# Patient Record
Sex: Female | Born: 1950
Health system: Southern US, Community
[De-identification: ages and names within clinical notes are randomized; demographics above are authoritative.]

## PROBLEM LIST (undated history)

## (undated) DIAGNOSIS — R718 Other abnormality of red blood cells: Secondary | ICD-10-CM

## (undated) DIAGNOSIS — E7800 Pure hypercholesterolemia, unspecified: Secondary | ICD-10-CM

## (undated) DIAGNOSIS — R112 Nausea with vomiting, unspecified: Secondary | ICD-10-CM

## (undated) DIAGNOSIS — F419 Anxiety disorder, unspecified: Secondary | ICD-10-CM

## (undated) DIAGNOSIS — Z9889 Other specified postprocedural states: Secondary | ICD-10-CM

## (undated) HISTORY — PX: ABDOMINAL HYSTERECTOMY: SHX81

## (undated) HISTORY — PX: TUBAL LIGATION: SHX77

## (undated) HISTORY — DX: Pure hypercholesterolemia, unspecified: E78.00

## (undated) HISTORY — PX: TONSILLECTOMY: SUR1361

---

## 2000-11-22 ENCOUNTER — Other Ambulatory Visit: Admission: RE | Admit: 2000-11-22 | Discharge: 2000-11-22 | Payer: Self-pay | Admitting: Obstetrics and Gynecology

## 2002-03-22 ENCOUNTER — Encounter: Admission: RE | Admit: 2002-03-22 | Discharge: 2002-03-22 | Payer: Self-pay | Admitting: Obstetrics and Gynecology

## 2002-03-22 ENCOUNTER — Encounter: Payer: Self-pay | Admitting: Obstetrics and Gynecology

## 2002-05-01 ENCOUNTER — Other Ambulatory Visit: Admission: RE | Admit: 2002-05-01 | Discharge: 2002-05-01 | Payer: Self-pay | Admitting: Obstetrics and Gynecology

## 2003-06-20 ENCOUNTER — Other Ambulatory Visit: Admission: RE | Admit: 2003-06-20 | Discharge: 2003-06-20 | Payer: Self-pay | Admitting: Obstetrics and Gynecology

## 2014-12-05 DIAGNOSIS — R109 Unspecified abdominal pain: Secondary | ICD-10-CM | POA: Diagnosis not present

## 2014-12-05 DIAGNOSIS — N949 Unspecified condition associated with female genital organs and menstrual cycle: Secondary | ICD-10-CM | POA: Diagnosis not present

## 2014-12-05 DIAGNOSIS — Z789 Other specified health status: Secondary | ICD-10-CM | POA: Diagnosis not present

## 2014-12-05 DIAGNOSIS — Z5329 Procedure and treatment not carried out because of patient's decision for other reasons: Secondary | ICD-10-CM | POA: Diagnosis not present

## 2014-12-05 DIAGNOSIS — Z713 Dietary counseling and surveillance: Secondary | ICD-10-CM | POA: Diagnosis not present

## 2014-12-05 DIAGNOSIS — Z6826 Body mass index (BMI) 26.0-26.9, adult: Secondary | ICD-10-CM | POA: Diagnosis not present

## 2014-12-05 DIAGNOSIS — Z418 Encounter for other procedures for purposes other than remedying health state: Secondary | ICD-10-CM | POA: Diagnosis not present

## 2014-12-09 ENCOUNTER — Other Ambulatory Visit (HOSPITAL_COMMUNITY): Payer: Self-pay | Admitting: Nurse Practitioner

## 2014-12-09 DIAGNOSIS — R1084 Generalized abdominal pain: Secondary | ICD-10-CM

## 2014-12-09 DIAGNOSIS — R52 Pain, unspecified: Secondary | ICD-10-CM

## 2014-12-10 ENCOUNTER — Ambulatory Visit (HOSPITAL_COMMUNITY)
Admission: RE | Admit: 2014-12-10 | Discharge: 2014-12-10 | Disposition: A | Discharge status: Home / Self Care | Payer: Medicare Other | Source: Ambulatory Visit | Attending: Nurse Practitioner | Admitting: Nurse Practitioner

## 2014-12-10 DIAGNOSIS — R102 Pelvic and perineal pain: Secondary | ICD-10-CM | POA: Diagnosis not present

## 2014-12-10 DIAGNOSIS — R52 Pain, unspecified: Secondary | ICD-10-CM

## 2014-12-10 DIAGNOSIS — R1084 Generalized abdominal pain: Secondary | ICD-10-CM

## 2014-12-10 DIAGNOSIS — Z90710 Acquired absence of both cervix and uterus: Secondary | ICD-10-CM | POA: Diagnosis not present

## 2014-12-10 DIAGNOSIS — K8020 Calculus of gallbladder without cholecystitis without obstruction: Secondary | ICD-10-CM | POA: Diagnosis not present

## 2014-12-10 DIAGNOSIS — R109 Unspecified abdominal pain: Secondary | ICD-10-CM | POA: Insufficient documentation

## 2014-12-12 DIAGNOSIS — K8020 Calculus of gallbladder without cholecystitis without obstruction: Secondary | ICD-10-CM | POA: Diagnosis not present

## 2014-12-19 DIAGNOSIS — K8020 Calculus of gallbladder without cholecystitis without obstruction: Secondary | ICD-10-CM | POA: Diagnosis not present

## 2014-12-20 NOTE — H&P (Signed)
  NTS SOAP Note  Vital Signs:  Vitals as of: 8/54/6270: Systolic 350: Diastolic 89: Heart Rate 71: Temp 98.96F: Height 84ft 2in: Weight 143Lbs 0 Ounces: Pain Level 1: BMI 26.15  BMI : 26.15 kg/m2  Subjective: This 64 year old female presents for of gallstones.  Has had intermittent episodes of right upper quadrant abdominal pain,  nausea,  and bloating.  Made worse with fatty food.  No fever,  chills,  jaundice.  U/S of gallbladder shows cholelithiasis,  normal common bile duct.  Review of Symptoms:  Constitutional:unremarkable   Head:unremarkable Eyes:unremarkable   Nose/Mouth/Throat:unremarkable Cardiovascular:  unremarkable Respiratory:unremarkable Gastrointestinabdominal pain, nausea, heartburn, dyspepsia Genitourinary:unremarkable   back pain Skin:unremarkable Hematolgic/Lymphatic:unremarkable   Allergic/Immunologic:unremarkable   Past Medical History:  Reviewed  Past Medical History  Surgical History: TAH,  d and c,  tonsillectomy Medical Problems: none Allergies: nkda Medications: none   Social History:Reviewed  Social History  Preferred Language: English Race:  White Ethnicity: Not Hispanic / Latino Age: 86 year Marital Status:  W Alcohol: no   Smoking Status: Never smoker reviewed on 12/19/2014 Functional Status reviewed on 12/19/2014 ------------------------------------------------ Bathing: Normal Cooking: Normal Dressing: Normal Driving: Normal Eating: Normal Managing Meds: Normal Oral Care: Normal Shopping: Normal Toileting: Normal Transferring: Normal Walking: Normal Cognitive Status reviewed on 12/19/2014 ------------------------------------------------ Attention: Normal Decision Making: Normal Language: Normal Memory: Normal Motor: Normal Perception: Normal Problem Solving: Normal Visual and Spatial: Normal   Family History:Reviewed  Family Health History Mother, Living; Disorder of thyroid gland;  Father,  Living; Malignant melanoma;     Objective Information: General:Well appearing, well nourished in no distress. Heart:RRR, no murmur or gallop.  Normal S1, S2.  No S3, S4.  Lungs:  CTA bilaterally, no wheezes, rhonchi, rales.  Breathing unlabored. Abdomen:Soft, NT/ND, no HSM, no masses.  Mild discomfort in right upper quadrant to deep palpation.  Assessment:Cholelithiasis  Diagnoses: 574.20  K80.20 Gallstone (Calculus of gallbladder without cholecystitis without obstruction)  Procedures: 09381 - OFFICE OUTPATIENT NEW 30 MINUTES    Plan:  Scheduled for laparoscopic cholecystectomy on 01/17/15.   Patient Education:Alternative treatments to surgery were discussed with patient (and family).  Risks and benefits  of procedure including bleeding,  infection,  hepatobiliary injury,  and the possibility of an open procedure were fully explained to the patient (and family) who gave informed consent. Patient/family questions were addressed.  Follow-up:Pending Surgery

## 2014-12-23 NOTE — H&P (Deleted)
  NTS SOAP Note  Vital Signs:  Vitals as of: 5/85/2778: Systolic 242: Diastolic 89: Heart Rate 71: Temp 98.42F: Height 64ft 2in: Weight 143Lbs 0 Ounces: Pain Level 1: BMI 26.15  BMI : 26.15 kg/m2  Subjective: This 64 year old female presents for of gallstones.  Has had intermittent episodes of right upper quadrant abdominal pain,  nausea,  and bloating.  Made worse with fatty food.  No fever,  chills,  jaundice.  U/S of gallbladder shows cholelithiasis,  normal common bile duct.  Review of Symptoms:  Constitutional:unremarkable   Head:unremarkable Eyes:unremarkable   Nose/Mouth/Throat:unremarkable Cardiovascular:  unremarkable Respiratory:unremarkable Gastrointestinabdominal pain, nausea, heartburn, dyspepsia Genitourinary:unremarkable   back pain Skin:unremarkable Hematolgic/Lymphatic:unremarkable   Allergic/Immunologic:unremarkable   Past Medical History:  Reviewed  Past Medical History  Surgical History: TAH,  d and c,  tonsillectomy Medical Problems: none Allergies: nkda Medications: none   Social History:Reviewed  Social History  Preferred Language: English Race:  White Ethnicity: Not Hispanic / Latino Age: 64 year Marital Status:  W Alcohol: no   Smoking Status: Never smoker reviewed on 12/19/2014 Functional Status reviewed on 12/19/2014 ------------------------------------------------ Bathing: Normal Cooking: Normal Dressing: Normal Driving: Normal Eating: Normal Managing Meds: Normal Oral Care: Normal Shopping: Normal Toileting: Normal Transferring: Normal Walking: Normal Cognitive Status reviewed on 12/19/2014 ------------------------------------------------ Attention: Normal Decision Making: Normal Language: Normal Memory: Normal Motor: Normal Perception: Normal Problem Solving: Normal Visual and Spatial: Normal   Family History:Reviewed  Family Health History Mother, Living; Disorder of thyroid gland;  Father,  Living; Malignant melanoma;     Objective Information: General:Well appearing, well nourished in no distress. Heart:RRR, no murmur or gallop.  Normal S1, S2.  No S3, S4.  Lungs:  CTA bilaterally, no wheezes, rhonchi, rales.  Breathing unlabored. Abdomen:Soft, NT/ND, no HSM, no masses.  Mild discomfort in right upper quadrant to deep palpation.  Assessment:Cholelithiasis  Diagnoses: 574.20  K80.20 Gallstone (Calculus of gallbladder without cholecystitis without obstruction)  Procedures: 35361 - OFFICE OUTPATIENT NEW 30 MINUTES    Plan:  Scheduled for laparoscopic cholecystectomy on 01/17/15.   Patient Education:Alternative treatments to surgery were discussed with patient (and family).  Risks and benefits  of procedure including bleeding,  infection,  hepatobiliary injury,  and the possibility of an open procedure were fully explained to the patient (and family) who gave informed consent. Patient/family questions were addressed.  Follow-up:Pending Surgery

## 2015-01-10 NOTE — Patient Instructions (Signed)
Rachel [name]  01/10/2015   Your procedure is scheduled on:  01/17/2015  Report to [facility] at  6  AM.  Call this number if you have problems the morning of surgery: [phone]   Remember:   Do not eat food or drink liquids after midnight.   Take these medicines the morning of surgery with A SIP OF WATER: none   Do not wear jewelry, make-up or nail polish.  Do not wear lotions, powders, or perfumes.   Do not shave 48 hours prior to surgery. Men may shave face and neck.  Do not bring valuables to the hospital.  [facility] is not responsible for any belongings or valuables.               Contacts, dentures or bridgework may not be worn into surgery.  Leave suitcase in the car. After surgery it may be brought to your room.  For patients admitted to the hospital, discharge time is determined by your treatment team.               Patients discharged the day of surgery will not be allowed to drive home.  Name and phone number of your driver: family  Special Instructions: Shower using CHG 2 nights before surgery and the night before surgery.  If you shower the day of surgery use CHG.  Use special wash - you have one bottle of CHG for all showers.  You should use approximately 1/3 of the bottle for each shower.   Please read over the following fact sheets that you were given: Pain Booklet, Coughing and Deep Breathing, Surgical Site Infection Prevention, Anesthesia Post-op Instructions and Care and Recovery After Surgery Laparoscopic Cholecystectomy Laparoscopic cholecystectomy is surgery to remove the gallbladder. The gallbladder is located in the upper right part of the abdomen, behind the liver. It is a storage sac for bile produced in the liver. Bile aids in the digestion and absorption of fats. Cholecystectomy is often done for inflammation of the gallbladder (cholecystitis). This condition is usually caused by a buildup of gallstones (cholelithiasis) in your gallbladder.  Gallstones can block the flow of bile, resulting in inflammation and pain. In severe cases, emergency surgery may be required. When emergency surgery is not required, you will have time to prepare for the procedure. Laparoscopic surgery is an alternative to open surgery. Laparoscopic surgery has a shorter recovery time. Your common bile duct may also need to be examined during the procedure. If stones are found in the common bile duct, they may be removed. LET [facility] CARE PROVIDER KNOW ABOUT:  Any allergies you have.  All medicines you are taking, including vitamins, herbs, eye drops, creams, and over-the-counter medicines.  Previous problems you or members of your family have had with the use of anesthetics.  Any blood disorders you have.  Previous surgeries you have had.  Medical conditions you have. RISKS AND COMPLICATIONS Generally, this is a safe procedure. However, as with any procedure, complications can occur. Possible complications include:  Infection.  Damage to the common bile duct, nerves, arteries, veins, or other internal organs such as the stomach, liver, or intestines.  Bleeding.  A stone may remain in the common bile duct.  A bile leak from the cyst duct that is clipped when your gallbladder is removed.  The need to convert to open surgery, which requires a larger incision in the abdomen. This may be necessary if your surgeon thinks it is not  safe to continue with a laparoscopic procedure. BEFORE THE PROCEDURE  Ask your health care provider about changing or stopping any regular medicines. You will need to stop taking aspirin or blood thinners at least 5 days prior to surgery.  Do not eat or drink anything after midnight the night before surgery.  Let your health care provider know if you develop a cold or other infectious problem before surgery. PROCEDURE   You will be given medicine to make you sleep through the procedure (general anesthetic). A breathing  tube will be placed in your mouth.  When you are asleep, your surgeon will make several small cuts (incisions) in your abdomen.  A thin, lighted tube with a tiny camera on the end (laparoscope) is inserted through one of the small incisions. The camera on the laparoscope sends a picture to a TV screen in the operating room. This gives the surgeon a good view inside your abdomen.  A gas will be pumped into your abdomen. This expands your abdomen so that the surgeon has more room to perform the surgery.  Other tools needed for the procedure are inserted through the other incisions. The gallbladder is removed through one of the incisions.  After the removal of your gallbladder, the incisions will be closed with stitches, staples, or skin glue. AFTER THE PROCEDURE  You will be taken to a recovery area where your progress will be checked often.  You may be allowed to go home the same day if your pain is controlled and you can tolerate liquids. Document Released: 09/20/2005 Document Revised: 07/11/2013 Document Reviewed: 05/02/2013 [facility] Patient Information 2015 [place], [place]. This information is not intended to replace advice given to you by your health care provider. Make sure you discuss any questions you have with your health care provider. PATIENT INSTRUCTIONS POST-ANESTHESIA  IMMEDIATELY FOLLOWING SURGERY:  Do not drive or operate machinery for the first twenty four hours after surgery.  Do not make any important decisions for twenty four hours after surgery or while taking narcotic pain medications or sedatives.  If you develop intractable nausea and vomiting or a severe headache please notify your doctor immediately.  FOLLOW-UP:  Please make an appointment with your surgeon as instructed. You do not need to follow up with anesthesia unless specifically instructed to do so.  WOUND CARE INSTRUCTIONS (if applicable):  Keep a dry clean dressing on the anesthesia/puncture wound site if  there is drainage.  Once the wound has quit draining you may leave it open to air.  Generally you should leave the bandage intact for twenty four hours unless there is drainage.  If the epidural site drains for more than 36-48 hours please call the anesthesia department.  QUESTIONS?:  Please feel free to call your physician or the hospital operator if you have any questions, and they will be happy to assist you.

## 2015-01-13 ENCOUNTER — Encounter (HOSPITAL_COMMUNITY)
Admission: RE | Admit: 2015-01-13 | Discharge: 2015-01-13 | Disposition: A | Discharge status: Home / Self Care | Payer: Medicare Other | Source: Ambulatory Visit | Attending: General Surgery | Admitting: General Surgery

## 2015-01-13 ENCOUNTER — Other Ambulatory Visit: Payer: Self-pay

## 2015-01-13 ENCOUNTER — Encounter (HOSPITAL_COMMUNITY): Payer: Self-pay

## 2015-01-13 DIAGNOSIS — Z01818 Encounter for other preprocedural examination: Secondary | ICD-10-CM | POA: Insufficient documentation

## 2015-01-13 LAB — BASIC METABOLIC PANEL
ANION GAP: 6 (ref 5–15)
BUN: 20 mg/dL (ref 6–23)
CHLORIDE: 104 mmol/L (ref 96–112)
CO2: 32 mmol/L (ref 19–32)
CREATININE: 0.69 mg/dL (ref 0.50–1.10)
Calcium: 9.5 mg/dL (ref 8.4–10.5)
GFR calc non Af Amer: 89 mL/min — ABNORMAL LOW (ref >90)
Glucose, Bld: 100 mg/dL — ABNORMAL HIGH (ref 70–99)
Potassium: 4.9 mmol/L (ref 3.5–5.1)
Sodium: 142 mmol/L (ref 135–145)

## 2015-01-13 LAB — HEPATIC FUNCTION PANEL
ALT: 17 U/L (ref 0–35)
AST: 21 U/L (ref 0–37)
Albumin: 4.5 g/dL (ref 3.5–5.2)
Alkaline Phosphatase: 51 U/L (ref 39–117)
BILIRUBIN TOTAL: 0.7 mg/dL (ref 0.3–1.2)
Bilirubin, Direct: <0.1 mg/dL (ref 0.0–0.5)
TOTAL PROTEIN: 7.4 g/dL (ref 6.0–8.3)

## 2015-01-13 LAB — CBC WITH DIFFERENTIAL/PLATELET
Basophils Absolute: 0 10*3/uL (ref 0.0–0.1)
Basophils Relative: 0 % (ref 0–1)
EOS ABS: 0.1 10*3/uL (ref 0.0–0.7)
EOS PCT: 2 % (ref 0–5)
HCT: 48.4 % — ABNORMAL HIGH (ref 36.0–46.0)
Hemoglobin: 15.5 g/dL — ABNORMAL HIGH (ref 12.0–15.0)
LYMPHS ABS: 1.3 10*3/uL (ref 0.7–4.0)
Lymphocytes Relative: 28 % (ref 12–46)
MCH: 29.5 pg (ref 26.0–34.0)
MCHC: 32 g/dL (ref 30.0–36.0)
MCV: 92.2 fL (ref 78.0–100.0)
Monocytes Absolute: 0.3 10*3/uL (ref 0.1–1.0)
Monocytes Relative: 6 % (ref 3–12)
NEUTROS PCT: 64 % (ref 43–77)
Neutro Abs: 3 10*3/uL (ref 1.7–7.7)
Platelets: 208 10*3/uL (ref 150–400)
RBC: 5.25 MIL/uL — AB (ref 3.87–5.11)
RDW: 13 % (ref 11.5–15.5)
WBC: 4.7 10*3/uL (ref 4.0–10.5)

## 2015-01-17 ENCOUNTER — Ambulatory Visit (HOSPITAL_COMMUNITY): Payer: Medicare Other | Admitting: Anesthesiology

## 2015-01-17 ENCOUNTER — Encounter (HOSPITAL_COMMUNITY)
Admission: RE | Disposition: A | Discharge status: Home / Self Care | Payer: Self-pay | Source: Ambulatory Visit | Attending: General Surgery

## 2015-01-17 ENCOUNTER — Ambulatory Visit (HOSPITAL_COMMUNITY)
Admission: RE | Admit: 2015-01-17 | Discharge: 2015-01-17 | Disposition: A | Discharge status: Home / Self Care | Payer: Medicare Other | Source: Ambulatory Visit | Attending: General Surgery | Admitting: General Surgery

## 2015-01-17 ENCOUNTER — Encounter (HOSPITAL_COMMUNITY): Payer: Self-pay | Admitting: *Deleted

## 2015-01-17 DIAGNOSIS — K8010 Calculus of gallbladder with chronic cholecystitis without obstruction: Secondary | ICD-10-CM | POA: Diagnosis not present

## 2015-01-17 DIAGNOSIS — K8020 Calculus of gallbladder without cholecystitis without obstruction: Secondary | ICD-10-CM | POA: Diagnosis not present

## 2015-01-17 HISTORY — PX: CHOLECYSTECTOMY: SHX55

## 2015-01-17 SURGERY — LAPAROSCOPIC CHOLECYSTECTOMY
Anesthesia: General

## 2015-01-17 MED ORDER — NEOSTIGMINE METHYLSULFATE 10 MG/10ML IV SOLN
INTRAVENOUS | Status: AC
  Filled 2015-01-17: qty 1

## 2015-01-17 MED ORDER — PROPOFOL 10 MG/ML IV BOLUS
INTRAVENOUS | Status: AC
  Filled 2015-01-17: qty 20

## 2015-01-17 MED ORDER — ROCURONIUM BROMIDE 50 MG/5ML IV SOLN
INTRAVENOUS | Status: AC
  Filled 2015-01-17: qty 1

## 2015-01-17 MED ORDER — GLYCOPYRROLATE 0.2 MG/ML IJ SOLN
INTRAMUSCULAR | Status: AC
  Filled 2015-01-17: qty 6

## 2015-01-17 MED ORDER — FENTANYL CITRATE (PF) 100 MCG/2ML IJ SOLN
50.0000 ug | INTRAMUSCULAR | Status: DC | PRN
  Administered 2015-01-17 (×2): 25 ug via INTRAVENOUS
  Filled 2015-01-17 (×2): qty 2

## 2015-01-17 MED ORDER — ONDANSETRON HCL 4 MG/2ML IJ SOLN: 4.0000 mg | Freq: Once | INTRAMUSCULAR | Status: DC | PRN

## 2015-01-17 MED ORDER — BUPIVACAINE HCL (PF) 0.5 % IJ SOLN
INTRAMUSCULAR | Status: AC
  Filled 2015-01-17: qty 30

## 2015-01-17 MED ORDER — LACTATED RINGERS IV SOLN
INTRAVENOUS | Status: DC
  Administered 2015-01-17: 07:00:00 via INTRAVENOUS

## 2015-01-17 MED ORDER — ROCURONIUM BROMIDE 100 MG/10ML IV SOLN
INTRAVENOUS | Status: DC | PRN
Start: 2015-01-17 — End: 2015-01-17
  Administered 2015-01-17: 25 mg via INTRAVENOUS
  Administered 2015-01-17: 5 mg via INTRAVENOUS

## 2015-01-17 MED ORDER — GLYCOPYRROLATE 0.2 MG/ML IJ SOLN
INTRAMUSCULAR | Status: DC | PRN
  Administered 2015-01-17: 0.6 mg via INTRAVENOUS

## 2015-01-17 MED ORDER — POVIDONE-IODINE 10 % OINT PACKET
TOPICAL_OINTMENT | CUTANEOUS | Status: DC | PRN
  Administered 2015-01-17: 1 via TOPICAL

## 2015-01-17 MED ORDER — KETOROLAC TROMETHAMINE 30 MG/ML IJ SOLN
INTRAMUSCULAR | Status: AC
  Filled 2015-01-17: qty 1

## 2015-01-17 MED ORDER — BUPIVACAINE-EPINEPHRINE (PF) 0.5% -1:200000 IJ SOLN
INTRAMUSCULAR | Status: AC
  Filled 2015-01-17: qty 30

## 2015-01-17 MED ORDER — CIPROFLOXACIN IN D5W 400 MG/200ML IV SOLN
400.0000 mg | INTRAVENOUS | Status: AC
  Administered 2015-01-17: 400 mg via INTRAVENOUS
  Filled 2015-01-17: qty 200

## 2015-01-17 MED ORDER — BUPIVACAINE HCL (PF) 0.5 % IJ SOLN
INTRAMUSCULAR | Status: DC | PRN
  Administered 2015-01-17: 10 mL

## 2015-01-17 MED ORDER — FENTANYL CITRATE (PF) 250 MCG/5ML IJ SOLN
INTRAMUSCULAR | Status: AC
  Filled 2015-01-17: qty 5

## 2015-01-17 MED ORDER — POVIDONE-IODINE 10 % EX OINT
TOPICAL_OINTMENT | CUTANEOUS | Status: AC
  Filled 2015-01-17: qty 1

## 2015-01-17 MED ORDER — PHENYLEPHRINE 40 MCG/ML (10ML) SYRINGE FOR IV PUSH (FOR BLOOD PRESSURE SUPPORT)
PREFILLED_SYRINGE | INTRAVENOUS | Status: AC
  Filled 2015-01-17: qty 10

## 2015-01-17 MED ORDER — PROPOFOL 10 MG/ML IV BOLUS
INTRAVENOUS | Status: DC | PRN
  Administered 2015-01-17: 30 mg via INTRAVENOUS
  Administered 2015-01-17: 140 mg via INTRAVENOUS

## 2015-01-17 MED ORDER — LIDOCAINE HCL (PF) 1 % IJ SOLN
INTRAMUSCULAR | Status: AC
  Filled 2015-01-17: qty 5

## 2015-01-17 MED ORDER — FENTANYL CITRATE (PF) 100 MCG/2ML IJ SOLN
25.0000 ug | INTRAMUSCULAR | Status: DC | PRN
  Administered 2015-01-17: 50 ug via INTRAVENOUS

## 2015-01-17 MED ORDER — HYDROCODONE-ACETAMINOPHEN 5-325 MG PO TABS: 1.0000 | ORAL_TABLET | ORAL | Status: AC | PRN

## 2015-01-17 MED ORDER — LIDOCAINE HCL (CARDIAC) 10 MG/ML IV SOLN
INTRAVENOUS | Status: DC | PRN
  Administered 2015-01-17: 30 mg via INTRAVENOUS

## 2015-01-17 MED ORDER — ONDANSETRON HCL 4 MG/2ML IJ SOLN
4.0000 mg | Freq: Once | INTRAMUSCULAR | Status: AC
  Administered 2015-01-17: 4 mg via INTRAVENOUS
  Filled 2015-01-17: qty 2

## 2015-01-17 MED ORDER — KETOROLAC TROMETHAMINE 30 MG/ML IJ SOLN
30.0000 mg | Freq: Once | INTRAMUSCULAR | Status: AC
  Administered 2015-01-17: 30 mg via INTRAVENOUS

## 2015-01-17 MED ORDER — GLYCOPYRROLATE 0.2 MG/ML IJ SOLN
INTRAMUSCULAR | Status: AC
  Filled 2015-01-17: qty 3

## 2015-01-17 MED ORDER — MIDAZOLAM HCL 2 MG/2ML IJ SOLN
1.0000 mg | INTRAMUSCULAR | Status: DC | PRN
  Administered 2015-01-17: 2 mg via INTRAVENOUS
  Filled 2015-01-17: qty 2

## 2015-01-17 MED ORDER — FENTANYL CITRATE (PF) 100 MCG/2ML IJ SOLN
INTRAMUSCULAR | Status: DC | PRN
  Administered 2015-01-17: 50 ug via INTRAVENOUS
  Administered 2015-01-17: 100 ug via INTRAVENOUS

## 2015-01-17 MED ORDER — BUPIVACAINE HCL (PF) 0.25 % IJ SOLN
INTRAMUSCULAR | Status: AC
  Filled 2015-01-17: qty 30

## 2015-01-17 MED ORDER — PHENYLEPHRINE HCL 10 MG/ML IJ SOLN
INTRAMUSCULAR | Status: DC | PRN
  Administered 2015-01-17: 40 ug via INTRAVENOUS

## 2015-01-17 MED ORDER — NEOSTIGMINE METHYLSULFATE 10 MG/10ML IV SOLN
INTRAVENOUS | Status: DC | PRN
  Administered 2015-01-17: 3 mg via INTRAVENOUS

## 2015-01-17 MED ORDER — SODIUM CHLORIDE 0.9 % IR SOLN
Status: DC | PRN
  Administered 2015-01-17: 1000 mL

## 2015-01-17 MED ORDER — HEMOSTATIC AGENTS (NO CHARGE) OPTIME
TOPICAL | Status: DC | PRN
  Administered 2015-01-17: 1 via TOPICAL

## 2015-01-17 MED ORDER — DEXAMETHASONE SODIUM PHOSPHATE 4 MG/ML IJ SOLN
4.0000 mg | Freq: Once | INTRAMUSCULAR | Status: AC
  Administered 2015-01-17: 4 mg via INTRAVENOUS

## 2015-01-17 MED ORDER — DEXAMETHASONE SODIUM PHOSPHATE 4 MG/ML IJ SOLN
INTRAMUSCULAR | Status: AC
  Filled 2015-01-17: qty 1

## 2015-01-17 SURGICAL SUPPLY — 40 items
APPLIER CLIP LAPSCP 10X32 DD (CLIP) ×2 IMPLANT
BAG HAMPER (MISCELLANEOUS) ×2 IMPLANT
CHLORAPREP W/TINT 26ML (MISCELLANEOUS) ×2 IMPLANT
CLOTH BEACON ORANGE TIMEOUT ST (SAFETY) ×2 IMPLANT
COVER LIGHT HANDLE STERIS (MISCELLANEOUS) ×4 IMPLANT
DECANTER SPIKE VIAL GLASS SM (MISCELLANEOUS) ×2 IMPLANT
ELECT REM PT RETURN 9FT ADLT (ELECTROSURGICAL) ×2
ELECTRODE REM PT RTRN 9FT ADLT (ELECTROSURGICAL) ×1 IMPLANT
FILTER SMOKE EVAC LAPAROSHD (FILTER) ×2 IMPLANT
FORMALIN 10 PREFIL 120ML (MISCELLANEOUS) ×2 IMPLANT
GLOVE BIOGEL PI IND STRL 7.0 (GLOVE) ×3 IMPLANT
GLOVE BIOGEL PI INDICATOR 7.0 (GLOVE) ×3
GLOVE ECLIPSE 6.5 STRL STRAW (GLOVE) ×4 IMPLANT
GLOVE SURG SS PI 7.5 STRL IVOR (GLOVE) ×2 IMPLANT
GOWN STRL REUS W/ TWL XL LVL3 (GOWN DISPOSABLE) ×1 IMPLANT
GOWN STRL REUS W/TWL LRG LVL3 (GOWN DISPOSABLE) ×4 IMPLANT
GOWN STRL REUS W/TWL XL LVL3 (GOWN DISPOSABLE) ×1
HEMOSTAT SNOW SURGICEL 2X4 (HEMOSTASIS) ×2 IMPLANT
INST SET LAPROSCOPIC AP (KITS) ×2 IMPLANT
IV NS IRRIG 3000ML ARTHROMATIC (IV SOLUTION) IMPLANT
KIT ROOM TURNOVER APOR (KITS) ×2 IMPLANT
MANIFOLD NEPTUNE II (INSTRUMENTS) ×2 IMPLANT
NEEDLE INSUFFLATION 14GA 120MM (NEEDLE) ×2 IMPLANT
NS IRRIG 1000ML POUR BTL (IV SOLUTION) ×2 IMPLANT
PACK LAP CHOLE LZT030E (CUSTOM PROCEDURE TRAY) ×2 IMPLANT
PAD ARMBOARD 7.5X6 YLW CONV (MISCELLANEOUS) ×2 IMPLANT
POUCH SPECIMEN RETRIEVAL 10MM (ENDOMECHANICALS) ×2 IMPLANT
SET BASIN LINEN APH (SET/KITS/TRAYS/PACK) ×2 IMPLANT
SET TUBE IRRIG SUCTION NO TIP (IRRIGATION / IRRIGATOR) IMPLANT
SLEEVE ENDOPATH XCEL 5M (ENDOMECHANICALS) ×2 IMPLANT
SPONGE GAUZE 2X2 8PLY STRL LF (GAUZE/BANDAGES/DRESSINGS) ×8 IMPLANT
STAPLER VISISTAT (STAPLE) ×2 IMPLANT
SUT VICRYL 0 UR6 27IN ABS (SUTURE) ×2 IMPLANT
TAPE CLOTH SURG 4X10 WHT LF (GAUZE/BANDAGES/DRESSINGS) ×2 IMPLANT
TROCAR ENDO BLADELESS 11MM (ENDOMECHANICALS) ×2 IMPLANT
TROCAR XCEL NON-BLD 5MMX100MML (ENDOMECHANICALS) ×2 IMPLANT
TROCAR XCEL UNIV SLVE 11M 100M (ENDOMECHANICALS) ×2 IMPLANT
TUBING INSUFFLATION (TUBING) ×2 IMPLANT
WARMER LAPAROSCOPE (MISCELLANEOUS) ×2 IMPLANT
YANKAUER SUCT 12FT TUBE ARGYLE (SUCTIONS) ×2 IMPLANT

## 2015-01-17 NOTE — Op Note (Signed)
Patient:  Rachel [name]  DOB:  11-24-1950  MRN:  [id number]   Preop Diagnosis:  Cholecystitis, cholelithiasis  Postop Diagnosis:  Same  Procedure:  Laparoscopic cholecystectomy  Surgeon:  Aviva Signs, M.D.  Anes:  Gen. endotracheal  Indications:  Patient is a 64 year old white female presents with biliary colic secondary to cholelithiasis. The risks and benefits of the procedure including bleeding, infection, hepatobiliary injury, and the possibility of an open procedure were fully explained to the patient, who gave informed consent.  Procedure note:  The patient was placed the supine position. After induction of general endotracheal anesthesia, the abdomen was prepped and draped using usual sterile technique with DuraPrep. Surgical site confirmation was performed.  A supraumbilical incision was made down to the fascia. A Veress needle was introduced into the abdominal cavity and confirmation of placement was done using the saline drop test. The abdomen was then insufflated to 16 mmHg pressure. An 11 mm trocar was introduced into the abdominal cavity under direct visualization without difficulty. The patient was placed in reverse Trendelenburg position and an additional 11 mm trocar was placed the epigastric region 5 mm trochars were placed the right upper quadrant and right flank regions. Liver was inspected and noted to be age-appropriate. The gallbladder was retracted in a dynamic fashion in order to expose the triangle of Calot. The cystic duct was first identified. Its junction to the infundibulum was fully identified. Endoclips were placed proximally and distally on the cystic duct, and the cystic duct was divided. This was likewise done to the cystic artery. The gallbladder was freed away from the gallbladder fossa using Bovie electrocautery. The gallbladder was delivered through the epigastric trocar site using an Endo Catch bag. The gallbladder fossa was inspected and no abnormal  bleeding or bile leakage was noted. Surgicel is placed the gallbladder fossa. All fluid and air were then evacuated from the abdominal cavity prior to removal of the trochars.  All wounds were irrigated with normal saline. All wounds were checked with 0.5% Sensorcaine. The supraumbilical fascia as well as epigastric fascia reapproximated using 0 Vicryl sutures. All skin incisions were closed using staples. Betadine ointment and dry sterile dressings were applied.  All tape and needle counts were correct the end of the procedure. Patient was extubated in the operating room and transferred to PACU in stable condition.  Complications:  None  EBL:  Minimal  Specimen:  Gallbladder

## 2015-01-17 NOTE — Interval H&P Note (Signed)
History and Physical Interval Note:  01/17/2015 7:20 AM  Rachel [name]  has presented today for surgery, with the diagnosis of cholelithiasis  The various methods of treatment have been discussed with the patient and family. After consideration of risks, benefits and other options for treatment, the patient has consented to  Procedure(s): LAPAROSCOPIC CHOLECYSTECTOMY (N/A) as a surgical intervention .  The patient's history has been reviewed, patient examined, no change in status, stable for surgery.  I have reviewed the patient's chart and labs.  Questions were answered to the patient's satisfaction.     Aviva Signs A

## 2015-01-17 NOTE — Discharge Instructions (Signed)

## 2015-01-17 NOTE — Anesthesia Postprocedure Evaluation (Signed)
Anesthesia Post Note  Patient: Rachel [name]  Procedure(s) Performed: Procedure(s) (LRB): LAPAROSCOPIC CHOLECYSTECTOMY (N/A)  Anesthesia type: General  Patient location: PACU  Post pain: Pain level controlled  Post assessment: Post-op Vital signs reviewed, Patient's Cardiovascular Status Stable, Respiratory Function Stable, Patent Airway, No signs of Nausea or vomiting and Pain level controlled  Last Vitals:  Filed Vitals:   01/17/15 0838  BP: 154/94  Pulse: 67  Temp: 36.5 C  Resp: 8    Post vital signs: Reviewed and stable  Level of consciousness: awake and alert   Complications: No apparent anesthesia complications

## 2015-01-17 NOTE — Anesthesia Procedure Notes (Signed)
Procedure Name: Intubation Date/Time: 01/17/2015 7:41 AM Performed by: Vista Deck Pre-anesthesia Checklist: Patient identified, Patient being monitored, Timeout performed, Emergency Drugs available and Suction available Patient Re-evaluated:Patient Re-evaluated prior to inductionOxygen Delivery Method: Circle System Utilized Preoxygenation: Pre-oxygenation with 100% oxygen Intubation Type: IV induction Ventilation: Mask ventilation without difficulty Laryngoscope Size: Miller and 2 Grade View: Grade I Tube type: Oral Tube size: 7.0 mm Number of attempts: 1 Airway Equipment and Method: Stylet and Oral airway Placement Confirmation: ETT inserted through vocal cords under direct vision,  positive ETCO2 and breath sounds checked- equal and bilateral Secured at: 21 cm Tube secured with: Tape Dental Injury: Teeth and Oropharynx as per pre-operative assessment

## 2015-01-17 NOTE — Transfer of Care (Signed)
Immediate Anesthesia Transfer of Care Note  Patient: Rachel [name]  Procedure(s) Performed: Procedure(s) (LRB): LAPAROSCOPIC CHOLECYSTECTOMY (N/A)  Patient Location: PACU  Anesthesia Type: General  Level of Consciousness: awake  Airway & Oxygen Therapy: Patient Spontanous Breathing and non-rebreather face mask  Post-op Assessment: Report given to PACU RN, Post -op Vital signs reviewed and stable and Patient moving all extremities  Post vital signs: Reviewed and stable  Complications: No apparent anesthesia complications

## 2015-01-17 NOTE — Anesthesia Preprocedure Evaluation (Signed)
Anesthesia Evaluation  Patient identified by MRN, date of birth, ID band Patient awake    Reviewed: Allergy & Precautions, NPO status , Patient's Chart, lab work & pertinent test results  Airway Mallampati: III  TM Distance: >3 FB     Dental  (+) Teeth Intact   Pulmonary neg pulmonary ROS,  breath sounds clear to auscultation        Cardiovascular negative cardio ROS  Rhythm:Regular     Neuro/Psych    GI/Hepatic negative GI ROS,   Endo/Other    Renal/GU      Musculoskeletal   Abdominal   Peds  Hematology   Anesthesia Other Findings   Reproductive/Obstetrics                             Anesthesia Physical Anesthesia Plan  ASA: I  Anesthesia Plan: General   Post-op Pain Management:    Induction: Intravenous  Airway Management Planned: Oral ETT  Additional Equipment:   Intra-op Plan:   Post-operative Plan: Extubation in OR  Informed Consent: I have reviewed the patients History and Physical, chart, labs and discussed the procedure including the risks, benefits and alternatives for the proposed anesthesia with the patient or authorized representative who has indicated his/her understanding and acceptance.     Plan Discussed with:   Anesthesia Plan Comments:         Anesthesia Quick Evaluation

## 2015-01-20 ENCOUNTER — Encounter (HOSPITAL_COMMUNITY): Payer: Self-pay | Admitting: General Surgery

## 2015-06-18 DIAGNOSIS — D2239 Melanocytic nevi of other parts of face: Secondary | ICD-10-CM | POA: Diagnosis not present

## 2015-09-05 ENCOUNTER — Other Ambulatory Visit: Payer: Self-pay | Admitting: Ophthalmology

## 2015-09-05 DIAGNOSIS — H02831 Dermatochalasis of right upper eyelid: Secondary | ICD-10-CM | POA: Diagnosis not present

## 2015-09-05 DIAGNOSIS — H532 Diplopia: Secondary | ICD-10-CM | POA: Diagnosis not present

## 2015-09-05 DIAGNOSIS — H2513 Age-related nuclear cataract, bilateral: Secondary | ICD-10-CM | POA: Diagnosis not present

## 2015-09-05 DIAGNOSIS — H04123 Dry eye syndrome of bilateral lacrimal glands: Secondary | ICD-10-CM | POA: Diagnosis not present

## 2015-09-05 DIAGNOSIS — Q150 Congenital glaucoma: Secondary | ICD-10-CM | POA: Diagnosis not present

## 2015-09-05 NOTE — Addendum Note (Signed)
Addended by: Omelia Blackwater on: 09/05/2015 04:44 PM   Modules accepted: Orders

## 2015-09-18 ENCOUNTER — Ambulatory Visit (HOSPITAL_COMMUNITY): Admission: RE | Admit: 2015-09-18 | Payer: Medicare Other | Source: Ambulatory Visit

## 2015-11-20 ENCOUNTER — Other Ambulatory Visit: Payer: Self-pay

## 2015-11-20 DIAGNOSIS — Z1231 Encounter for screening mammogram for malignant neoplasm of breast: Secondary | ICD-10-CM

## 2015-12-02 DIAGNOSIS — K099 Cyst of oral region, unspecified: Secondary | ICD-10-CM | POA: Diagnosis not present

## 2015-12-02 DIAGNOSIS — M279 Disease of jaws, unspecified: Secondary | ICD-10-CM | POA: Diagnosis not present

## 2015-12-10 DIAGNOSIS — K099 Cyst of oral region, unspecified: Secondary | ICD-10-CM | POA: Diagnosis not present

## 2015-12-10 DIAGNOSIS — M279 Disease of jaws, unspecified: Secondary | ICD-10-CM | POA: Diagnosis not present

## 2015-12-31 ENCOUNTER — Ambulatory Visit
Admission: RE | Admit: 2015-12-31 | Discharge: 2015-12-31 | Disposition: A | Discharge status: Home / Self Care | Payer: Medicare Other | Source: Ambulatory Visit

## 2015-12-31 DIAGNOSIS — Z1231 Encounter for screening mammogram for malignant neoplasm of breast: Secondary | ICD-10-CM | POA: Diagnosis not present

## 2016-02-04 DIAGNOSIS — R109 Unspecified abdominal pain: Secondary | ICD-10-CM | POA: Diagnosis not present

## 2016-02-04 DIAGNOSIS — Z789 Other specified health status: Secondary | ICD-10-CM | POA: Diagnosis not present

## 2016-02-04 DIAGNOSIS — M539 Dorsopathy, unspecified: Secondary | ICD-10-CM | POA: Diagnosis not present

## 2016-02-04 DIAGNOSIS — Z299 Encounter for prophylactic measures, unspecified: Secondary | ICD-10-CM | POA: Diagnosis not present

## 2016-02-04 DIAGNOSIS — R1030 Lower abdominal pain, unspecified: Secondary | ICD-10-CM | POA: Diagnosis not present

## 2016-02-18 DIAGNOSIS — Z0000 Encounter for general adult medical examination without abnormal findings: Secondary | ICD-10-CM | POA: Diagnosis not present

## 2016-02-18 DIAGNOSIS — Z1211 Encounter for screening for malignant neoplasm of colon: Secondary | ICD-10-CM | POA: Diagnosis not present

## 2016-03-25 DIAGNOSIS — C44321 Squamous cell carcinoma of skin of nose: Secondary | ICD-10-CM | POA: Diagnosis not present

## 2016-03-25 DIAGNOSIS — D485 Neoplasm of uncertain behavior of skin: Secondary | ICD-10-CM | POA: Diagnosis not present

## 2016-05-10 DIAGNOSIS — Z85828 Personal history of other malignant neoplasm of skin: Secondary | ICD-10-CM | POA: Diagnosis not present

## 2016-08-12 DIAGNOSIS — Z85828 Personal history of other malignant neoplasm of skin: Secondary | ICD-10-CM | POA: Diagnosis not present

## 2016-09-09 DIAGNOSIS — H02839 Dermatochalasis of unspecified eye, unspecified eyelid: Secondary | ICD-10-CM | POA: Diagnosis not present

## 2016-11-24 DIAGNOSIS — I889 Nonspecific lymphadenitis, unspecified: Secondary | ICD-10-CM | POA: Diagnosis not present

## 2017-01-20 ENCOUNTER — Other Ambulatory Visit: Payer: Self-pay | Admitting: Family Medicine

## 2017-01-20 DIAGNOSIS — Z1231 Encounter for screening mammogram for malignant neoplasm of breast: Secondary | ICD-10-CM

## 2017-01-24 IMAGING — US US ABDOMEN COMPLETE
1 series · 14 of 25 positions shown · non-contrast
Comparison: None.

CLINICAL DATA: Abdominal pain.  Known cholelithiasis.

EXAM:
ULTRASOUND ABDOMEN COMPLETE

[Series 1: us abdomen complete · 0.15mm/px · 14 of 127 slices shown]
[im 1/127]
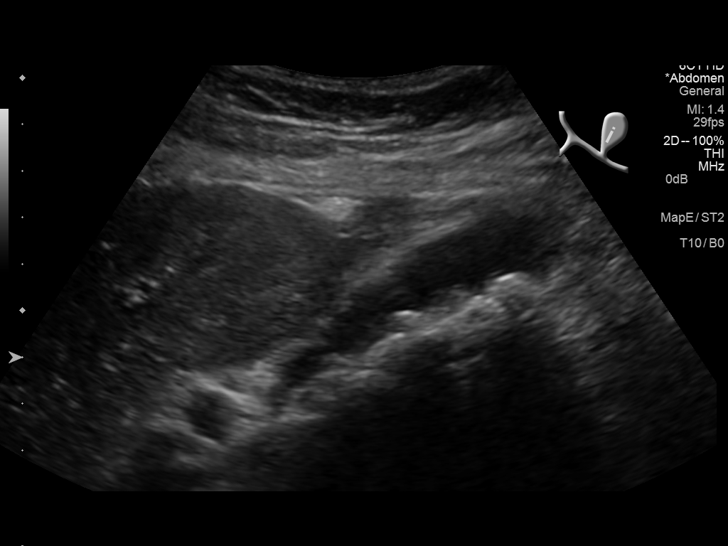
[im 11/127]
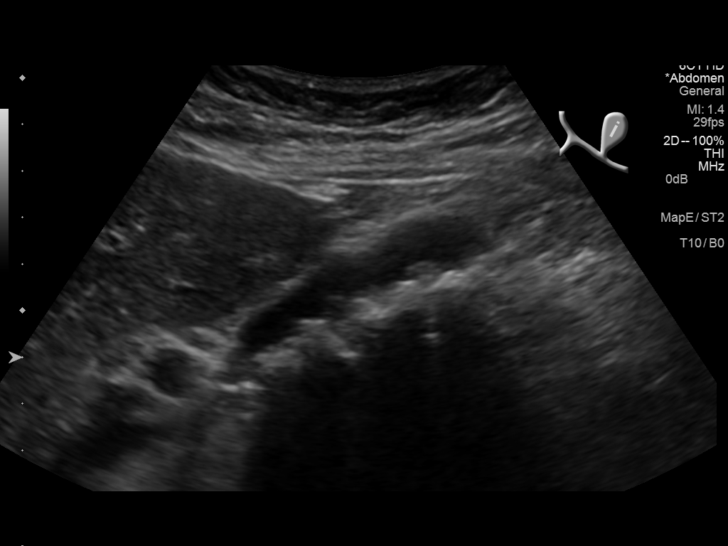
[im 22/127]
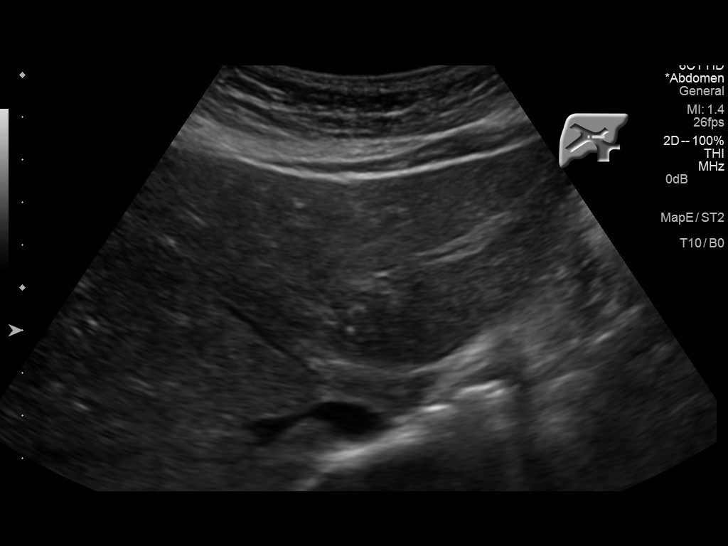
[im 32/127]
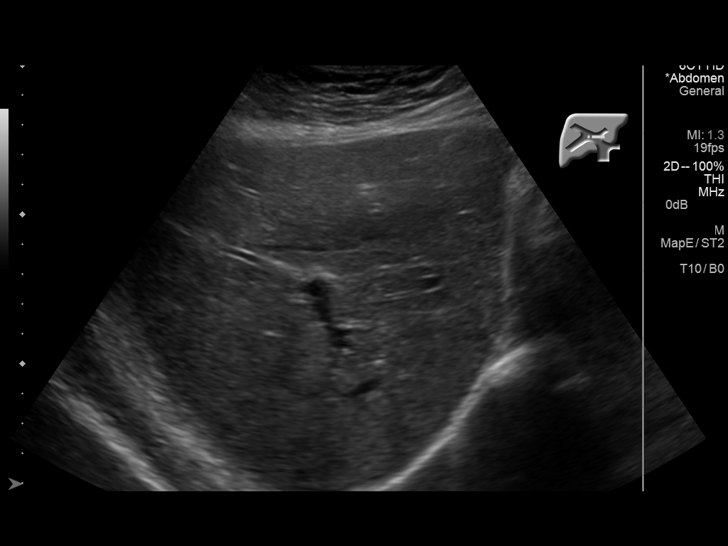
[im 43/127]
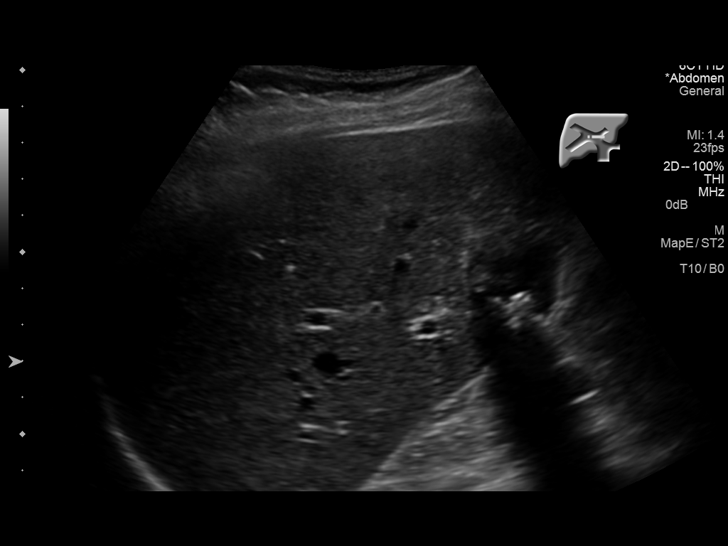
[im 48/127]
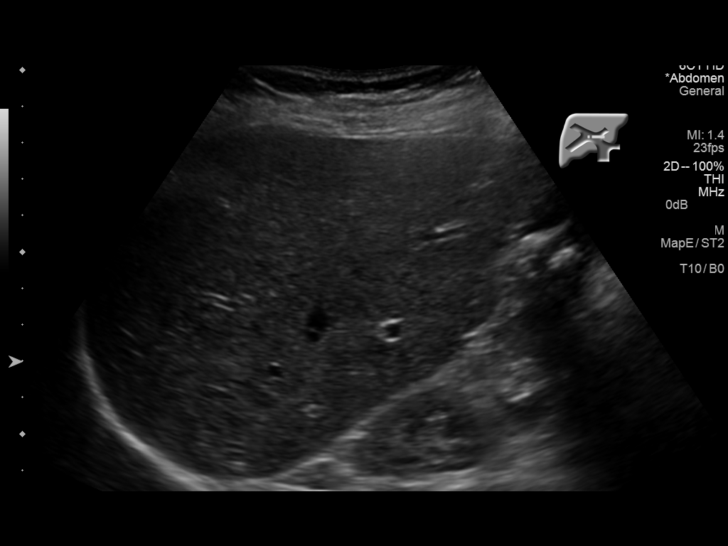
[im 58/127]
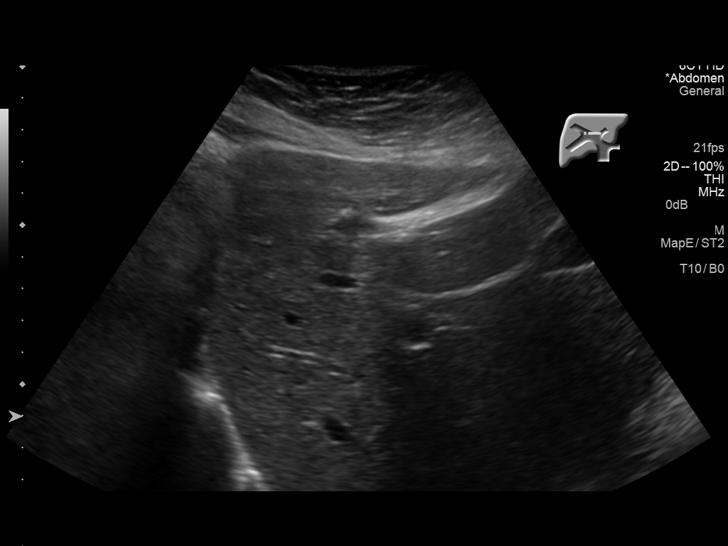
[im 69/127]
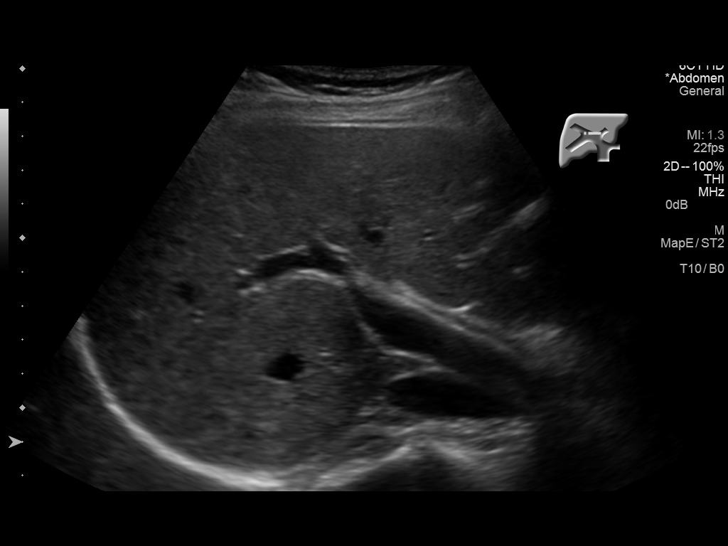
[im 79/127]
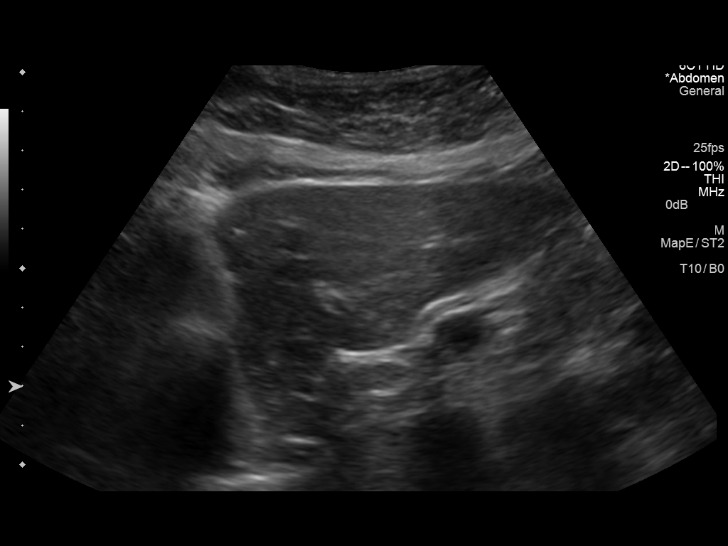
[im 85/127]
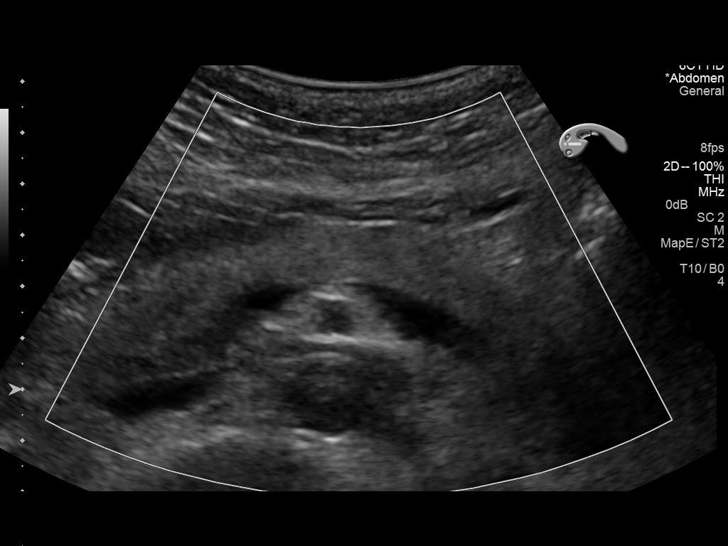
[im 95/127]
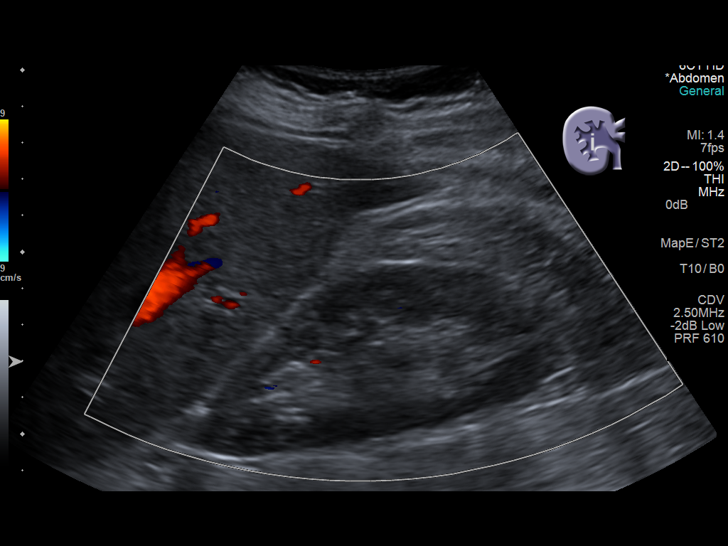
[im 106/127]
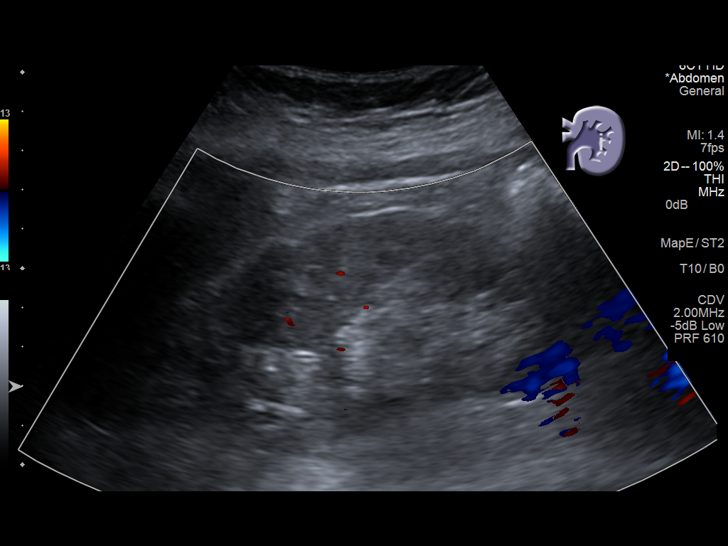
[im 116/127]
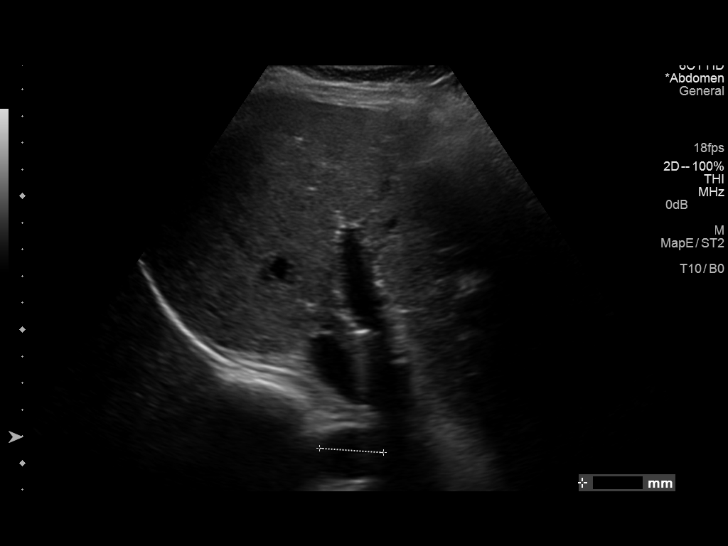
[im 127/127]
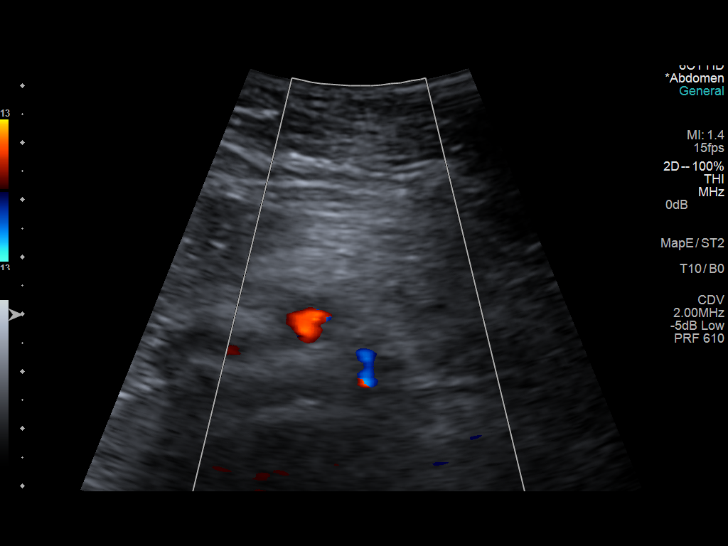

[14 of 25 positions shown; findings below may reference images not displayed]

FINDINGS: Gallbladder: Multiple mobile shadowing echogenic stones are
demonstrated within the gallbladder lumen. No gallbladder wall
thickening. No pericholecystic fluid.

Common bile duct: Diameter: 4 mm

Liver: No focal lesion identified. Within normal limits in
parenchymal echogenicity.

IVC: No abnormality visualized.

Pancreas: Visualized portion unremarkable.

Spleen: Size and appearance within normal limits.

Right Kidney: Length: 11.1 cm. Echogenicity within normal limits. No
mass or hydronephrosis visualized.

Left Kidney: Length: 12.1 cm. Echogenicity within normal limits. No
mass or hydronephrosis visualized.

Abdominal aorta: No aneurysm visualized.

Other findings: None.
IMPRESSION: Cholelithiasis without sonographic evidence for acute cholecystitis.

## 2017-01-24 IMAGING — US US PELVIS COMPLETE
1 series · 11 of 11 positions shown · non-contrast
Comparison: None

CLINICAL DATA: Pelvic pain for 8 months.

EXAM:
TRANSABDOMINAL AND TRANSVAGINAL ULTRASOUND OF PELVIS
TECHNIQUE: Both transabdominal and transvaginal ultrasound examinations of the
pelvis were performed. Transabdominal technique was performed for
global imaging of the pelvis including uterus, ovaries, adnexal
regions, and pelvic cul-de-sac. It was necessary to proceed with
endovaginal exam following the transabdominal exam to visualize the
adnexal structures.

[Series 1: us pelvis complete · 0.24mm/px · 11 of 11 slices shown]
[im 1/11]
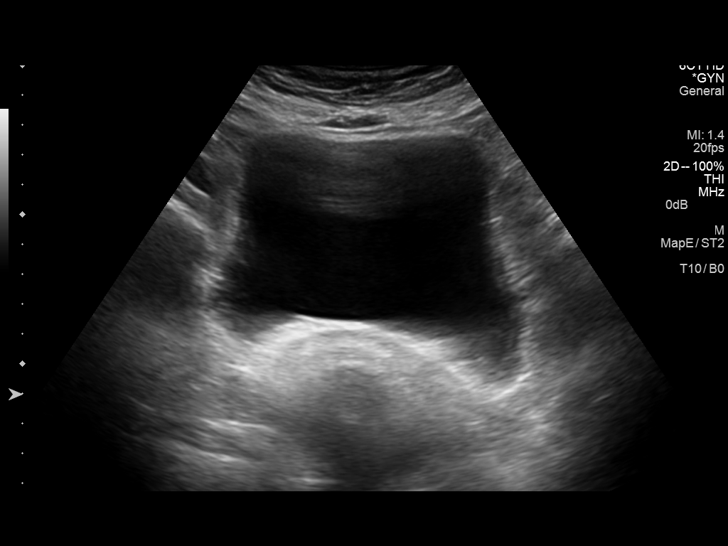
[im 2/11]
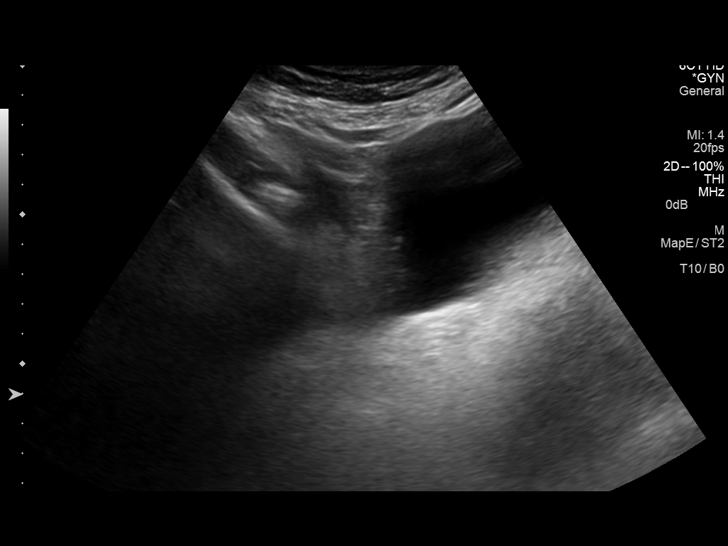
[im 3/11]
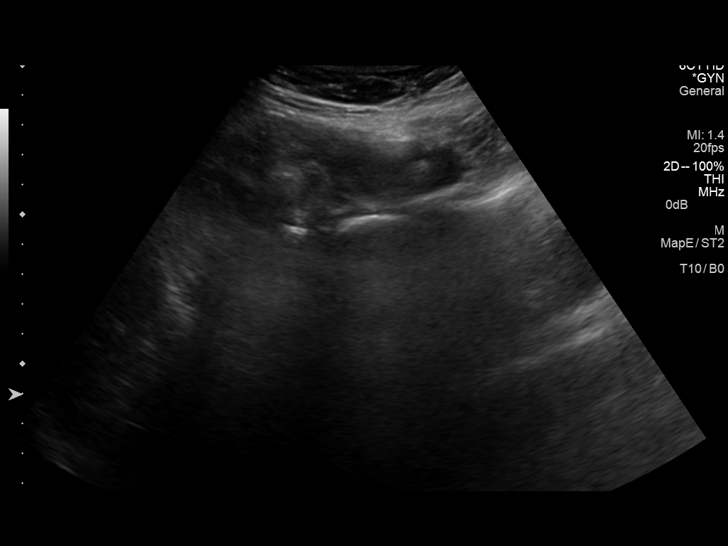
[im 4/11]
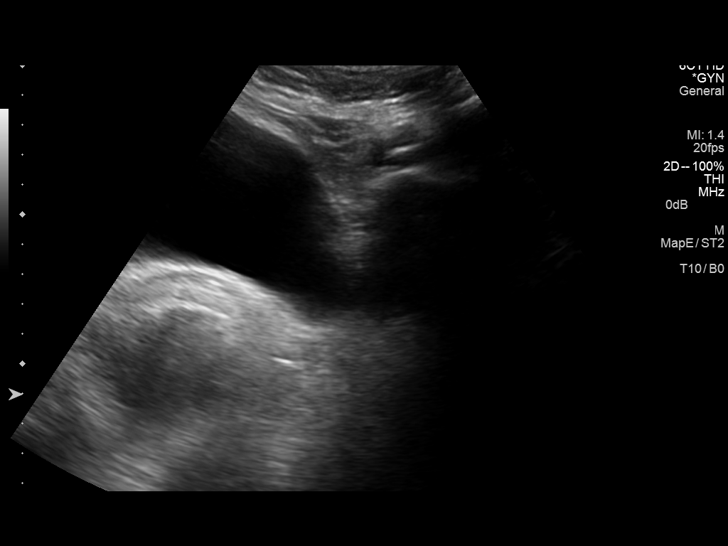
[im 5/11]
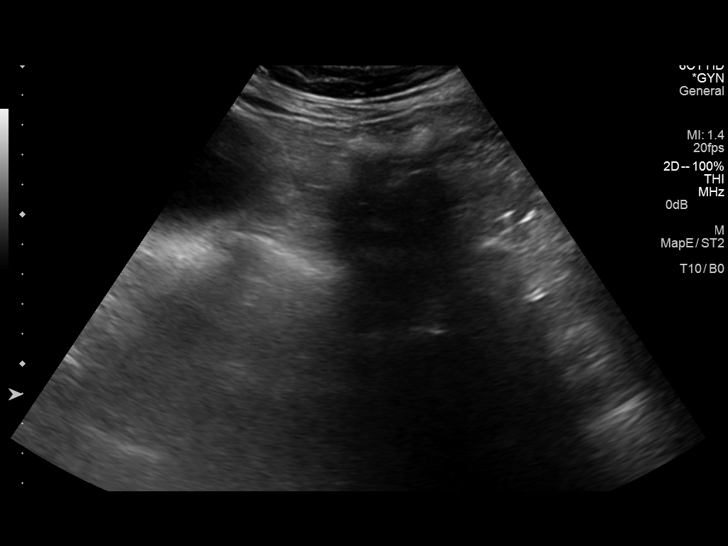
[im 6/11]
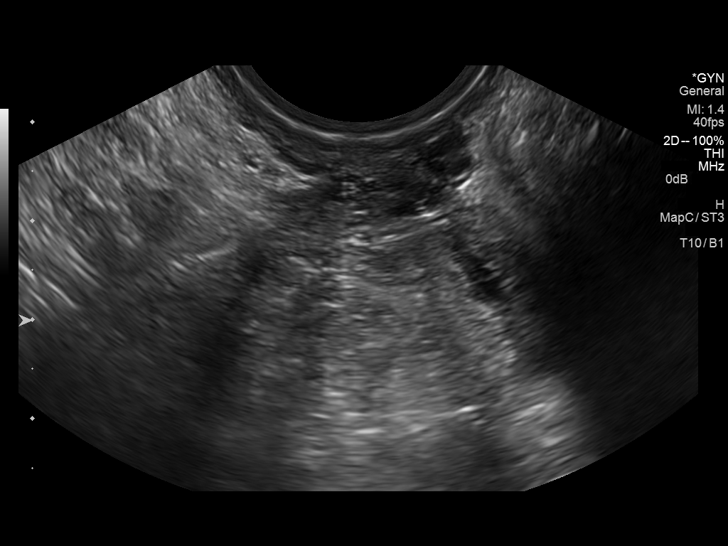
[im 7/11]
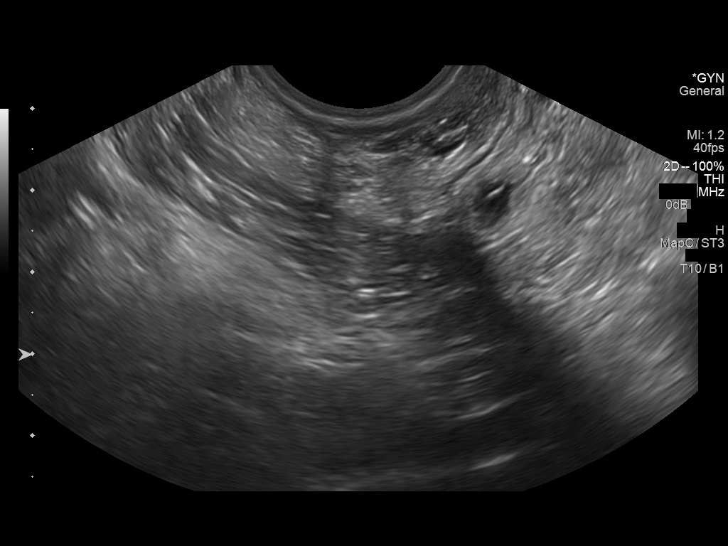
[im 8/11]
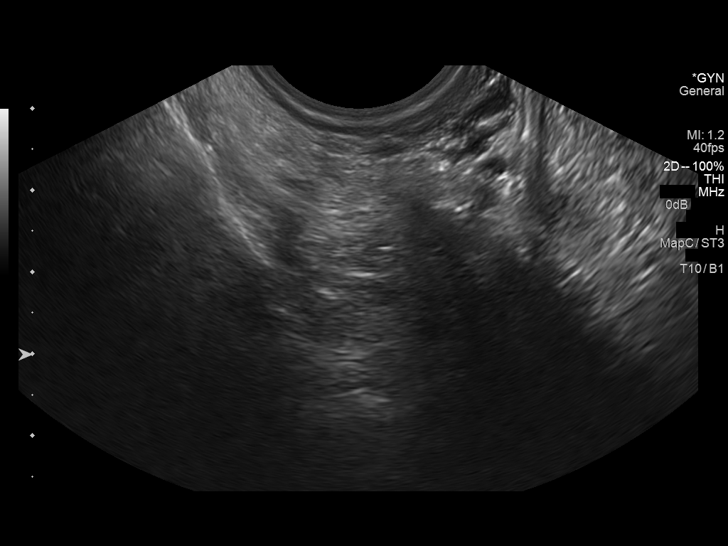
[im 9/11]
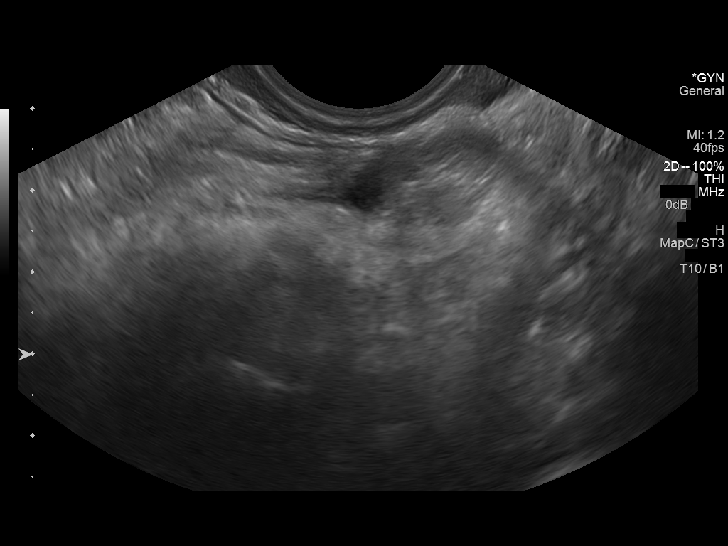
[im 10/11]
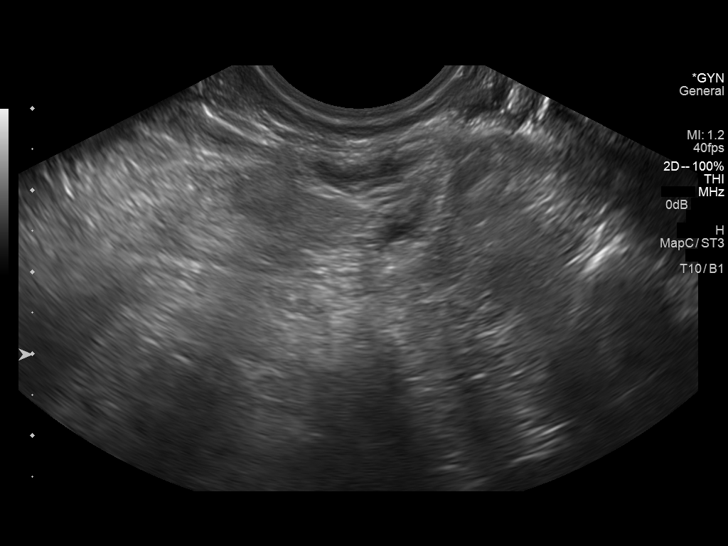
[im 11/11]
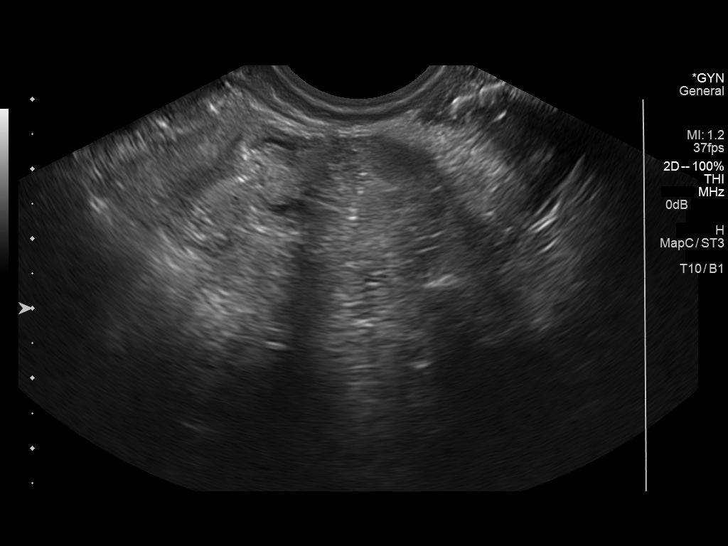

[11 of 11 positions shown; findings below may reference images not displayed]

FINDINGS: Uterus

Surgically absent.

Right ovary

Not visualized.

Left ovary

Not visualized.

Other findings

No free fluid.
IMPRESSION: Uterus is surgically absent.

The ovaries are not visualized.

## 2017-02-15 ENCOUNTER — Ambulatory Visit
Admission: RE | Admit: 2017-02-15 | Discharge: 2017-02-15 | Disposition: A | Discharge status: Home / Self Care | Payer: Medicare Other | Source: Ambulatory Visit | Attending: Family Medicine | Admitting: Family Medicine

## 2017-02-15 DIAGNOSIS — Z1231 Encounter for screening mammogram for malignant neoplasm of breast: Secondary | ICD-10-CM | POA: Diagnosis not present

## 2017-02-18 DIAGNOSIS — E663 Overweight: Secondary | ICD-10-CM | POA: Diagnosis not present

## 2017-02-18 DIAGNOSIS — M8580 Other specified disorders of bone density and structure, unspecified site: Secondary | ICD-10-CM | POA: Diagnosis not present

## 2017-02-18 DIAGNOSIS — Z6827 Body mass index (BMI) 27.0-27.9, adult: Secondary | ICD-10-CM | POA: Diagnosis not present

## 2017-02-18 DIAGNOSIS — M4004 Postural kyphosis, thoracic region: Secondary | ICD-10-CM | POA: Diagnosis not present

## 2017-02-18 DIAGNOSIS — Z1211 Encounter for screening for malignant neoplasm of colon: Secondary | ICD-10-CM | POA: Diagnosis not present

## 2017-02-18 DIAGNOSIS — Z0000 Encounter for general adult medical examination without abnormal findings: Secondary | ICD-10-CM | POA: Diagnosis not present

## 2017-02-22 DIAGNOSIS — L905 Scar conditions and fibrosis of skin: Secondary | ICD-10-CM | POA: Diagnosis not present

## 2017-02-22 DIAGNOSIS — Z85828 Personal history of other malignant neoplasm of skin: Secondary | ICD-10-CM | POA: Diagnosis not present

## 2017-05-25 DIAGNOSIS — Z85828 Personal history of other malignant neoplasm of skin: Secondary | ICD-10-CM | POA: Diagnosis not present

## 2017-05-25 DIAGNOSIS — L821 Other seborrheic keratosis: Secondary | ICD-10-CM | POA: Diagnosis not present

## 2017-05-25 DIAGNOSIS — D225 Melanocytic nevi of trunk: Secondary | ICD-10-CM | POA: Diagnosis not present

## 2017-05-25 DIAGNOSIS — D1801 Hemangioma of skin and subcutaneous tissue: Secondary | ICD-10-CM | POA: Diagnosis not present

## 2018-01-04 ENCOUNTER — Other Ambulatory Visit: Payer: Self-pay | Admitting: Family Medicine

## 2018-01-04 DIAGNOSIS — Z1231 Encounter for screening mammogram for malignant neoplasm of breast: Secondary | ICD-10-CM

## 2018-02-09 ENCOUNTER — Ambulatory Visit (INDEPENDENT_AMBULATORY_CARE_PROVIDER_SITE_OTHER): Payer: Medicare Other

## 2018-02-09 ENCOUNTER — Ambulatory Visit (INDEPENDENT_AMBULATORY_CARE_PROVIDER_SITE_OTHER): Payer: Medicare Other | Admitting: Podiatry

## 2018-02-09 DIAGNOSIS — M21612 Bunion of left foot: Secondary | ICD-10-CM | POA: Diagnosis not present

## 2018-02-09 DIAGNOSIS — M21611 Bunion of right foot: Secondary | ICD-10-CM

## 2018-02-09 NOTE — Patient Instructions (Signed)
Pre-Operative Instructions  Congratulations, you have decided to take an important step towards improving your quality of life.  You can be assured that the doctors and staff at [facility] will be with you every step of the way.  Here are some important things you should know:  1. Plan to be at the surgery center/hospital at least 1 (one) hour prior to your scheduled time, unless otherwise directed by the surgical center/hospital staff.  You must have a responsible adult accompany you, remain during the surgery and drive you home.  Make sure you have directions to the surgical center/hospital to ensure you arrive on time. 2. If you are having surgery at [facility] or [facility] hospitals, you will need a copy of your medical history and physical form from your family physician within one month prior to the date of surgery. We will give you a form for your primary physician to complete.  3. We make every effort to accommodate the date you request for surgery.  However, there are times where surgery dates or times have to be moved.  We will contact you as soon as possible if a change in schedule is required.   4. No aspirin/ibuprofen for one week before surgery.  If you are on aspirin, any non-steroidal anti-inflammatory medications (Mobic, Aleve, Ibuprofen) should not be taken seven (7) days prior to your surgery.  You make take Tylenol for pain prior to surgery.  5. Medications - If you are taking daily heart and blood pressure medications, seizure, reflux, allergy, asthma, anxiety, pain or diabetes medications, make sure you notify the surgery center/hospital before the day of surgery so they can tell you which medications you should take or avoid the day of surgery. 6. No food or drink after midnight the night before surgery unless directed otherwise by surgical center/hospital staff. 7. No alcoholic beverages 24-hours prior to surgery.  No smoking 24-hours prior or 24-hours after  surgery. 8. Wear loose pants or shorts. They should be loose enough to fit over bandages, boots, and casts. 9. Don't wear slip-on shoes. Sneakers are preferred. 10. Bring your boot with you to the surgery center/hospital.  Also bring crutches or a walker if your physician has prescribed it for you.  If you do not have this equipment, it will be provided for you after surgery. 11. If you have not been contacted by the surgery center/hospital by the day before your surgery, call to confirm the date and time of your surgery. 12. Leave-time from work may vary depending on the type of surgery you have.  Appropriate arrangements should be made prior to surgery with your employer. 13. Prescriptions will be provided immediately following surgery by your doctor.  Fill these as soon as possible after surgery and take the medication as directed. Pain medications will not be refilled on weekends and must be approved by the doctor. 14. Remove nail polish on the operative foot and avoid getting pedicures prior to surgery. 15. Wash the night before surgery.  The night before surgery wash the foot and leg well with water and the antibacterial soap provided. Be sure to pay special attention to beneath the toenails and in between the toes.  Wash for at least three (3) minutes. Rinse thoroughly with water and dry well with a towel.  Perform this wash unless told not to do so by your physician.  Enclosed: 1 Ice pack (please put in freezer the night before surgery)   1 Hibiclens skin cleaner     Pre-op instructions  If you have any questions regarding the instructions, please do not hesitate to call our office.  [place]: [address], [place], [place] [address] -- [phone]  [place]: [address] [place], [place] [address] -- [phone]  [place]: [address]  , [place] [address] -- [phone]  [place]: [address], [address], [place], [place] [address] -- [phone]  Website:  [email or web address] 

## 2018-02-16 ENCOUNTER — Telehealth: Payer: Self-pay | Admitting: *Deleted

## 2018-02-16 NOTE — Telephone Encounter (Signed)
"  Do you call insurance to verify before surgery?"  I attempted to call the patient and inform her that Medicare does not require authorization.  They cover 80% and the BC[employer] supplement will cover the other 20%.

## 2018-02-20 NOTE — Telephone Encounter (Signed)
I attempted to return her call again, her voicemail is not set up.

## 2018-03-20 ENCOUNTER — Ambulatory Visit
Admission: RE | Admit: 2018-03-20 | Discharge: 2018-03-20 | Disposition: A | Discharge status: Home / Self Care | Payer: Medicare Other | Source: Ambulatory Visit | Attending: Family Medicine | Admitting: Family Medicine

## 2018-03-20 DIAGNOSIS — Z1231 Encounter for screening mammogram for malignant neoplasm of breast: Secondary | ICD-10-CM | POA: Diagnosis not present

## 2018-04-11 DIAGNOSIS — Z01818 Encounter for other preprocedural examination: Secondary | ICD-10-CM | POA: Diagnosis not present

## 2018-04-11 DIAGNOSIS — H02413 Mechanical ptosis of bilateral eyelids: Secondary | ICD-10-CM | POA: Diagnosis not present

## 2018-05-08 DIAGNOSIS — H02412 Mechanical ptosis of left eyelid: Secondary | ICD-10-CM | POA: Diagnosis not present

## 2018-05-08 DIAGNOSIS — H02834 Dermatochalasis of left upper eyelid: Secondary | ICD-10-CM | POA: Diagnosis not present

## 2018-05-08 DIAGNOSIS — H02411 Mechanical ptosis of right eyelid: Secondary | ICD-10-CM | POA: Diagnosis not present

## 2018-05-08 DIAGNOSIS — H02831 Dermatochalasis of right upper eyelid: Secondary | ICD-10-CM | POA: Diagnosis not present

## 2018-05-08 DIAGNOSIS — H02413 Mechanical ptosis of bilateral eyelids: Secondary | ICD-10-CM | POA: Diagnosis not present

## 2018-06-11 NOTE — Progress Notes (Signed)
  Subjective:  Patient ID: Rachel [name], female    DOB: 02/13/1951,  MRN: [id number]  No chief complaint on file.   67 y.o. female presents with the above complaint. Complains of bilateral bunions. Not painful but curious about surgery. Reports painful corn to the right second toe.   Review of Systems: Negative except as noted in the HPI. Denies N/V/F/Ch.  No past medical history on file.  Current Outpatient Medications:  .  naproxen sodium (ANAPROX) 220 MG tablet, Take 220 mg by mouth daily as needed., Disp: , Rfl:   Social History   Tobacco Use  Smoking Status Never Smoker    No Known Allergies Objective:  There were no vitals filed for this visit. There is no height or weight on file to calculate BMI. Constitutional Well developed. Well nourished.  Vascular Dorsalis pedis pulses palpable bilaterally. Posterior tibial pulses palpable bilaterally. Capillary refill normal to all digits.  No cyanosis or clubbing noted. Pedal hair growth normal.  Neurologic Normal speech. Oriented to person, place, and time. Epicritic sensation to light touch grossly present bilaterally.  Dermatologic Nails well groomed and normal in appearance. No open wounds. No skin lesions.  Orthopedic: Normal joint ROM without pain or crepitus bilaterally. No visible deformities. 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 Bilateral bunions    Plan:  Patient was evaluated and treated and all questions answered.  Hallux adductor valgus -X-rays taken reviewed -Discussed padding proper shoe gear -Patient has failed all conservative therapy and wishes to proceed with surgical intervention. All risks, benefits, and alternatives discussed with patient. No guarantees given. Consent reviewed and signed by patient. -Planned procedures: [name]    Return for post op  care.

## 2018-07-31 DIAGNOSIS — B078 Other viral warts: Secondary | ICD-10-CM | POA: Diagnosis not present

## 2018-07-31 DIAGNOSIS — L821 Other seborrheic keratosis: Secondary | ICD-10-CM | POA: Diagnosis not present

## 2018-07-31 DIAGNOSIS — L820 Inflamed seborrheic keratosis: Secondary | ICD-10-CM | POA: Diagnosis not present

## 2018-07-31 DIAGNOSIS — D225 Melanocytic nevi of trunk: Secondary | ICD-10-CM | POA: Diagnosis not present

## 2018-07-31 DIAGNOSIS — L814 Other melanin hyperpigmentation: Secondary | ICD-10-CM | POA: Diagnosis not present

## 2018-07-31 DIAGNOSIS — D1801 Hemangioma of skin and subcutaneous tissue: Secondary | ICD-10-CM | POA: Diagnosis not present

## 2018-07-31 DIAGNOSIS — Z85828 Personal history of other malignant neoplasm of skin: Secondary | ICD-10-CM | POA: Diagnosis not present

## 2018-12-13 DIAGNOSIS — Z0000 Encounter for general adult medical examination without abnormal findings: Secondary | ICD-10-CM | POA: Diagnosis not present

## 2018-12-13 DIAGNOSIS — Z1389 Encounter for screening for other disorder: Secondary | ICD-10-CM | POA: Diagnosis not present

## 2018-12-14 ENCOUNTER — Other Ambulatory Visit: Payer: Self-pay | Admitting: Family Medicine

## 2018-12-14 DIAGNOSIS — M8580 Other specified disorders of bone density and structure, unspecified site: Secondary | ICD-10-CM

## 2018-12-14 DIAGNOSIS — M859 Disorder of bone density and structure, unspecified: Secondary | ICD-10-CM

## 2018-12-14 DIAGNOSIS — Z1231 Encounter for screening mammogram for malignant neoplasm of breast: Secondary | ICD-10-CM

## 2019-01-26 DIAGNOSIS — Z1159 Encounter for screening for other viral diseases: Secondary | ICD-10-CM | POA: Diagnosis not present

## 2019-01-26 DIAGNOSIS — R413 Other amnesia: Secondary | ICD-10-CM | POA: Diagnosis not present

## 2019-01-26 DIAGNOSIS — Z0000 Encounter for general adult medical examination without abnormal findings: Secondary | ICD-10-CM | POA: Diagnosis not present

## 2019-03-26 ENCOUNTER — Other Ambulatory Visit: Payer: Medicare Other

## 2019-03-26 ENCOUNTER — Ambulatory Visit: Payer: Medicare Other

## 2019-04-02 ENCOUNTER — Other Ambulatory Visit: Payer: Self-pay

## 2019-04-02 ENCOUNTER — Ambulatory Visit
Admission: RE | Admit: 2019-04-02 | Discharge: 2019-04-02 | Disposition: A | Discharge status: Home / Self Care | Payer: Medicare Other | Source: Ambulatory Visit | Attending: Family Medicine | Admitting: Family Medicine

## 2019-04-02 DIAGNOSIS — M8580 Other specified disorders of bone density and structure, unspecified site: Secondary | ICD-10-CM

## 2019-04-02 DIAGNOSIS — M859 Disorder of bone density and structure, unspecified: Secondary | ICD-10-CM

## 2019-04-02 DIAGNOSIS — Z780 Asymptomatic menopausal state: Secondary | ICD-10-CM | POA: Diagnosis not present

## 2019-04-02 DIAGNOSIS — Z136 Encounter for screening for cardiovascular disorders: Secondary | ICD-10-CM | POA: Diagnosis not present

## 2019-04-02 DIAGNOSIS — Z1211 Encounter for screening for malignant neoplasm of colon: Secondary | ICD-10-CM | POA: Diagnosis not present

## 2019-04-02 DIAGNOSIS — Z1231 Encounter for screening mammogram for malignant neoplasm of breast: Secondary | ICD-10-CM

## 2019-04-02 DIAGNOSIS — R413 Other amnesia: Secondary | ICD-10-CM | POA: Diagnosis not present

## 2019-04-02 DIAGNOSIS — Z1159 Encounter for screening for other viral diseases: Secondary | ICD-10-CM | POA: Diagnosis not present

## 2019-04-02 DIAGNOSIS — M85851 Other specified disorders of bone density and structure, right thigh: Secondary | ICD-10-CM | POA: Diagnosis not present

## 2019-04-02 DIAGNOSIS — Z0000 Encounter for general adult medical examination without abnormal findings: Secondary | ICD-10-CM | POA: Diagnosis not present

## 2019-08-01 DIAGNOSIS — Z85828 Personal history of other malignant neoplasm of skin: Secondary | ICD-10-CM | POA: Diagnosis not present

## 2019-08-01 DIAGNOSIS — L570 Actinic keratosis: Secondary | ICD-10-CM | POA: Diagnosis not present

## 2019-08-01 DIAGNOSIS — L814 Other melanin hyperpigmentation: Secondary | ICD-10-CM | POA: Diagnosis not present

## 2019-08-01 DIAGNOSIS — L820 Inflamed seborrheic keratosis: Secondary | ICD-10-CM | POA: Diagnosis not present

## 2019-08-01 DIAGNOSIS — D225 Melanocytic nevi of trunk: Secondary | ICD-10-CM | POA: Diagnosis not present

## 2019-08-01 DIAGNOSIS — D1801 Hemangioma of skin and subcutaneous tissue: Secondary | ICD-10-CM | POA: Diagnosis not present

## 2019-08-01 DIAGNOSIS — L821 Other seborrheic keratosis: Secondary | ICD-10-CM | POA: Diagnosis not present

## 2020-01-03 DIAGNOSIS — Z23 Encounter for immunization: Secondary | ICD-10-CM | POA: Diagnosis not present

## 2020-01-23 ENCOUNTER — Other Ambulatory Visit: Payer: Self-pay | Admitting: Family Medicine

## 2020-01-23 DIAGNOSIS — Z1231 Encounter for screening mammogram for malignant neoplasm of breast: Secondary | ICD-10-CM

## 2020-01-25 DIAGNOSIS — Z23 Encounter for immunization: Secondary | ICD-10-CM | POA: Diagnosis not present

## 2020-01-29 DIAGNOSIS — Z6827 Body mass index (BMI) 27.0-27.9, adult: Secondary | ICD-10-CM | POA: Diagnosis not present

## 2020-01-29 DIAGNOSIS — Z0000 Encounter for general adult medical examination without abnormal findings: Secondary | ICD-10-CM | POA: Diagnosis not present

## 2020-01-29 DIAGNOSIS — E663 Overweight: Secondary | ICD-10-CM | POA: Diagnosis not present

## 2020-01-29 DIAGNOSIS — M8580 Other specified disorders of bone density and structure, unspecified site: Secondary | ICD-10-CM | POA: Diagnosis not present

## 2020-01-29 DIAGNOSIS — Z1211 Encounter for screening for malignant neoplasm of colon: Secondary | ICD-10-CM | POA: Diagnosis not present

## 2020-02-07 ENCOUNTER — Ambulatory Visit
Admission: EM | Admit: 2020-02-07 | Discharge: 2020-02-07 | Disposition: A | Discharge status: Home / Self Care | Payer: Medicare Other | Attending: Emergency Medicine | Admitting: Emergency Medicine

## 2020-02-07 ENCOUNTER — Other Ambulatory Visit: Payer: Self-pay

## 2020-02-07 DIAGNOSIS — R1011 Right upper quadrant pain: Secondary | ICD-10-CM | POA: Diagnosis not present

## 2020-02-07 LAB — POCT URINALYSIS DIP (MANUAL ENTRY)
Bilirubin, UA: NEGATIVE
Glucose, UA: NEGATIVE mg/dL
Leukocytes, UA: NEGATIVE
Nitrite, UA: NEGATIVE
Protein Ur, POC: NEGATIVE mg/dL
Spec Grav, UA: >=1.03 — AB (ref 1.010–1.025)
Urobilinogen, UA: 0.2 E.U./dL
pH, UA: 5.5 (ref 5.0–8.0)

## 2020-02-07 NOTE — ED Triage Notes (Signed)
Pt presents with right upper quadrant pain that began 2 days ago . Pt has had cholecystectomy

## 2020-02-07 NOTE — Discharge Instructions (Signed)
Unable to rule out abdominal  in urgent care setting.  Offered patient further evaluation and management in the ED.  Patient declines at this time and would like to try outpatient therapy first.  Aware of the risk associated with this decision including missed diagnosis, organ damage, organ failure, and/or death.  Patient aware and in agreement.     Get rest and drink fluids Start keeping a log of when your symptoms occur Follow up with PCP next weekend for recheck If you experience new or worsening symptoms return or go to ER such as fever, chills, nausea, vomiting, diarrhea, bloody or dark tarry stools, constipation, urinary symptoms, worsening abdominal discomfort, symptoms that do not improve with medications, inability to keep fluids down, etc..Marland Kitchen

## 2020-02-07 NOTE — ED Provider Notes (Signed)
[facility]   [id number] 02/07/20 Arrival Time: [id number]  CC: ABDOMINAL DISCOMFORT  SUBJECTIVE:  Rachel [name] is a 69 y.o. female who presents with complaint of abdominal discomfort that began 2 days ago.  States she has been helping son renovating house.  Localizes pain to RUQ.  Describes as constant and dull in character.  Has tried OTC medications without relief.  Worse with eating.  Denies similar symptoms in the past.  Last BM yesterday.    Denies fever, chills, nausea, vomiting, chest pain, SOB, diarrhea, constipation, hematochezia, melena, dysuria, difficulty urinating, increased frequency or urgency, flank pain, loss of bowel or bladder function.   No LMP recorded. Patient has had a hysterectomy.  ROS: As per HPI.  All other pertinent ROS negative.     No past medical history on file. Past Surgical History:  Procedure Laterality Date  . ABDOMINAL HYSTERECTOMY    . CHOLECYSTECTOMY N/A 01/17/2015   Procedure: LAPAROSCOPIC CHOLECYSTECTOMY;  Surgeon: Aviva Signs Md, MD;  Location: [facility];  Service: General;  Laterality: N/A;  . TONSILLECTOMY    . TUBAL LIGATION     No Known Allergies No current facility-administered medications on file prior to encounter.   Current Outpatient Medications on File Prior to Encounter  Medication Sig Dispense Refill  . naproxen sodium (ANAPROX) 220 MG tablet Take 220 mg by mouth daily as needed.     Social History   Socioeconomic History  . Marital status: Widowed    Spouse name: Not on file  . Number of children: Not on file  . Years of education: Not on file  . Highest education level: Not on file  Occupational History  . Not on file  Tobacco Use  . Smoking status: Never Smoker  Substance and Sexual Activity  . Alcohol use: No  . Drug use: No  . Sexual activity: Never    Birth control/protection: Surgical  Other Topics Concern  . Not on file  Social History Narrative  . Not on file   Social Determinants of Health    Financial Resource Strain:   . Difficulty of Paying Living Expenses:   Food Insecurity:   . Worried About Charity fundraiser in the Last Year:   . Arboriculturist in the Last Year:   Transportation Needs:   . Film/video editor (Medical):   Marland Kitchen Lack of Transportation (Non-Medical):   Physical Activity:   . Days of Exercise per Week:   . Minutes of Exercise per Session:   Stress:   . Feeling of Stress :   Social Connections:   . Frequency of Communication with Friends and Family:   . Frequency of Social Gatherings with Friends and Family:   . Attends Religious Services:   . Active Member of Clubs or Organizations:   . Attends Archivist Meetings:   Marland Kitchen Marital Status:   Intimate Partner Violence:   . Fear of Current or Ex-Partner:   . Emotionally Abused:   Marland Kitchen Physically Abused:   . Sexually Abused:    No family history on file.   OBJECTIVE:  Vitals:   02/07/20 1016  BP: 135/71  Pulse: 97  Resp: 20  Temp: 98.4 F (36.9 C)  SpO2: 95%    General appearance: Alert; NAD HEENT: NCAT.  Oropharynx clear.  Lungs: clear to auscultation bilaterally without adventitious breath sounds Heart: regular rate and rhythm.   Abdomen: soft, non-distended; normal active bowel sounds; mildly TTP over RUQ; nontender at McBurney's  point; negative Murphy's sign; no guarding Back: no CVA tenderness Extremities: no edema; symmetrical with no gross deformities Skin: warm and dry Neurologic: normal gait Psychological: alert and cooperative; normal mood and affect  LABS: Results for orders placed or performed during the hospital encounter of 02/07/20 (from the past 24 hour(s))  POCT urinalysis dipstick     Status: Abnormal   Collection Time: 02/07/20 10:27 AM  Result Value Ref Range   Color, UA yellow yellow   Clarity, UA clear clear   Glucose, UA negative negative mg/dL   Bilirubin, UA negative negative   Ketones, POC UA small (15) (A) negative mg/dL   Spec Grav, UA  >=1.030 (A) 1.010 - 1.025   Blood, UA trace-intact (A) negative   pH, UA 5.5 5.0 - 8.0   Protein Ur, POC negative negative mg/dL   Urobilinogen, UA 0.2 0.2 or 1.0 E.U./dL   Nitrite, UA Negative Negative   Leukocytes, UA Negative Negative   ASSESSMENT & PLAN:  1. Abdominal discomfort in right upper quadrant    Unable to rule out abdominal  in urgent care setting.  Offered patient further evaluation and management in the ED.  Patient declines at this time and would like to try outpatient therapy first.  Aware of the risk associated with this decision including missed diagnosis, organ damage, organ failure, and/or death.  Patient aware and in agreement.     Get rest and drink fluids Start keeping a log of when your symptoms occur Follow up with PCP next weekend for recheck If you experience new or worsening symptoms return or go to ER such as fever, chills, nausea, vomiting, diarrhea, bloody or dark tarry stools, constipation, urinary symptoms, worsening abdominal discomfort, symptoms that do not improve with medications, inability to keep fluids down, etc...  Reviewed expectations re: course of current medical issues. Questions answered. Outlined signs and symptoms indicating need for more acute intervention. Patient verbalized understanding. After Visit Summary given.   Lestine Box, PA-C 02/07/20 1150

## 2020-03-04 HISTORY — PX: MOUTH SURGERY: SHX715

## 2020-03-18 DIAGNOSIS — R1011 Right upper quadrant pain: Secondary | ICD-10-CM | POA: Diagnosis not present

## 2020-03-18 DIAGNOSIS — K5900 Constipation, unspecified: Secondary | ICD-10-CM | POA: Diagnosis not present

## 2020-03-18 DIAGNOSIS — H6121 Impacted cerumen, right ear: Secondary | ICD-10-CM | POA: Diagnosis not present

## 2020-04-08 ENCOUNTER — Other Ambulatory Visit: Payer: Self-pay

## 2020-04-08 ENCOUNTER — Ambulatory Visit
Admission: RE | Admit: 2020-04-08 | Discharge: 2020-04-08 | Disposition: A | Discharge status: Home / Self Care | Payer: Medicare Other | Source: Ambulatory Visit | Attending: Family Medicine | Admitting: Family Medicine

## 2020-04-08 DIAGNOSIS — Z1231 Encounter for screening mammogram for malignant neoplasm of breast: Secondary | ICD-10-CM | POA: Diagnosis not present

## 2020-04-22 DIAGNOSIS — L578 Other skin changes due to chronic exposure to nonionizing radiation: Secondary | ICD-10-CM | POA: Diagnosis not present

## 2020-04-22 DIAGNOSIS — L570 Actinic keratosis: Secondary | ICD-10-CM | POA: Diagnosis not present

## 2020-04-22 DIAGNOSIS — D485 Neoplasm of uncertain behavior of skin: Secondary | ICD-10-CM | POA: Diagnosis not present

## 2020-06-19 DIAGNOSIS — L578 Other skin changes due to chronic exposure to nonionizing radiation: Secondary | ICD-10-CM | POA: Diagnosis not present

## 2020-06-19 DIAGNOSIS — C44319 Basal cell carcinoma of skin of other parts of face: Secondary | ICD-10-CM | POA: Diagnosis not present

## 2020-06-19 DIAGNOSIS — D485 Neoplasm of uncertain behavior of skin: Secondary | ICD-10-CM | POA: Diagnosis not present

## 2020-06-19 DIAGNOSIS — L820 Inflamed seborrheic keratosis: Secondary | ICD-10-CM | POA: Diagnosis not present

## 2020-07-04 HISTORY — PX: MOHS SURGERY: SUR867

## 2020-07-24 DIAGNOSIS — C44319 Basal cell carcinoma of skin of other parts of face: Secondary | ICD-10-CM | POA: Diagnosis not present

## 2020-07-24 DIAGNOSIS — C44311 Basal cell carcinoma of skin of nose: Secondary | ICD-10-CM | POA: Diagnosis not present

## 2020-07-31 DIAGNOSIS — D485 Neoplasm of uncertain behavior of skin: Secondary | ICD-10-CM | POA: Diagnosis not present

## 2020-07-31 DIAGNOSIS — L905 Scar conditions and fibrosis of skin: Secondary | ICD-10-CM | POA: Diagnosis not present

## 2020-07-31 DIAGNOSIS — L821 Other seborrheic keratosis: Secondary | ICD-10-CM | POA: Diagnosis not present

## 2020-07-31 DIAGNOSIS — L720 Epidermal cyst: Secondary | ICD-10-CM | POA: Diagnosis not present

## 2020-07-31 DIAGNOSIS — D225 Melanocytic nevi of trunk: Secondary | ICD-10-CM | POA: Diagnosis not present

## 2020-07-31 DIAGNOSIS — Z85828 Personal history of other malignant neoplasm of skin: Secondary | ICD-10-CM | POA: Diagnosis not present

## 2020-07-31 DIAGNOSIS — L814 Other melanin hyperpigmentation: Secondary | ICD-10-CM | POA: Diagnosis not present

## 2020-07-31 DIAGNOSIS — C44529 Squamous cell carcinoma of skin of other part of trunk: Secondary | ICD-10-CM | POA: Diagnosis not present

## 2020-07-31 DIAGNOSIS — D1801 Hemangioma of skin and subcutaneous tissue: Secondary | ICD-10-CM | POA: Diagnosis not present

## 2020-07-31 DIAGNOSIS — L820 Inflamed seborrheic keratosis: Secondary | ICD-10-CM | POA: Diagnosis not present

## 2020-09-01 DIAGNOSIS — R0781 Pleurodynia: Secondary | ICD-10-CM | POA: Diagnosis not present

## 2020-09-10 ENCOUNTER — Other Ambulatory Visit: Payer: Self-pay

## 2020-09-10 ENCOUNTER — Ambulatory Visit (INDEPENDENT_AMBULATORY_CARE_PROVIDER_SITE_OTHER): Payer: Medicare Other | Admitting: Family Medicine

## 2020-09-10 ENCOUNTER — Encounter: Payer: Self-pay | Admitting: Family Medicine

## 2020-09-10 DIAGNOSIS — R109 Unspecified abdominal pain: Secondary | ICD-10-CM

## 2020-09-10 DIAGNOSIS — R1031 Right lower quadrant pain: Secondary | ICD-10-CM

## 2020-09-10 NOTE — Progress Notes (Signed)
   Office Visit Note   Patient: Rachel [name]           Date of Birth: [birth year]           MRN: [id number] Visit Date: 09/10/2020 Requested by: No referring provider defined for this encounter. PCP: Patient, No Pcp Per  Subjective: Chief Complaint  Patient presents with  . right flank pain (achy) since 5/21, NKI    HPI: She is here with right lower abdomen pain.  Symptoms started in May, no injury.  She went to the ER and evaluation was unrevealing.  She continued to have pain so she went back to her PCP who did some more testing including for UTI.  Testing was negative.  She has continued to have pain since then in the right flank area.  No fevers or chills, no constipation or diarrhea, no urinary frequency, burning, hematuria.  She does note that around that time she had lost more than 20 pounds on a ketogenic diet.  She is status post cholecystectomy years ago.  She never had a rash.               ROS:   All other systems were reviewed and are negative.  Objective: Vital Signs: There were no vitals taken for this visit.  Physical Exam:  General:  Alert and oriented, in no acute distress. Pulm:  Breathing unlabored. Psy:  Normal mood, congruent affect  Abdomen: She has mild tenderness in the right lower anterior abdomen in the muscle wall, no palpable defect.  She has no hepatosplenomegaly.  No CVA tenderness.  She has no tenderness around the thoracolumbar spine and no scoliosis.    Imaging: No results found.  Assessment & Plan: 1.  Chronic right flank pain, etiology uncertain. -We will order CT scan to look for hernia, rule out nephrolithiasis.  If negative, then physical therapy in [place].     Procedures: No procedures performed  No notes on file     PMFS History: There are no problems to display for this patient.  History reviewed. No pertinent past medical history.  History reviewed. No pertinent family history.  Past Surgical History:  Procedure  Laterality Date  . ABDOMINAL HYSTERECTOMY    . CHOLECYSTECTOMY N/A 01/17/2015   Procedure: LAPAROSCOPIC CHOLECYSTECTOMY;  Surgeon: Aviva Signs Md, MD;  Location: [facility];  Service: General;  Laterality: N/A;  . TONSILLECTOMY    . TUBAL LIGATION     Social History   Occupational History  . Not on file  Tobacco Use  . Smoking status: Never Smoker  Substance and Sexual Activity  . Alcohol use: No  . Drug use: No  . Sexual activity: Never    Birth control/protection: Surgical

## 2020-09-11 ENCOUNTER — Ambulatory Visit
Admission: RE | Admit: 2020-09-11 | Discharge: 2020-09-11 | Disposition: A | Discharge status: Home / Self Care | Payer: Medicare Other | Source: Ambulatory Visit | Attending: Family Medicine | Admitting: Family Medicine

## 2020-09-11 DIAGNOSIS — N200 Calculus of kidney: Secondary | ICD-10-CM | POA: Diagnosis not present

## 2020-09-11 DIAGNOSIS — R1031 Right lower quadrant pain: Secondary | ICD-10-CM

## 2020-09-11 DIAGNOSIS — R109 Unspecified abdominal pain: Secondary | ICD-10-CM | POA: Diagnosis not present

## 2020-09-12 ENCOUNTER — Telehealth: Payer: Self-pay | Admitting: Family Medicine

## 2020-09-12 DIAGNOSIS — R109 Unspecified abdominal pain: Secondary | ICD-10-CM

## 2020-09-12 NOTE — Telephone Encounter (Signed)
CT shows a kidney stone on the left side, but nothing to explain the right-sided pain.

## 2020-09-16 DIAGNOSIS — Z85828 Personal history of other malignant neoplasm of skin: Secondary | ICD-10-CM | POA: Diagnosis not present

## 2020-09-16 DIAGNOSIS — L821 Other seborrheic keratosis: Secondary | ICD-10-CM | POA: Diagnosis not present

## 2020-09-16 DIAGNOSIS — C44529 Squamous cell carcinoma of skin of other part of trunk: Secondary | ICD-10-CM | POA: Diagnosis not present

## 2020-09-16 DIAGNOSIS — L814 Other melanin hyperpigmentation: Secondary | ICD-10-CM | POA: Diagnosis not present

## 2020-09-16 DIAGNOSIS — L905 Scar conditions and fibrosis of skin: Secondary | ICD-10-CM | POA: Diagnosis not present

## 2020-09-24 DIAGNOSIS — R109 Unspecified abdominal pain: Secondary | ICD-10-CM | POA: Diagnosis not present

## 2020-09-24 DIAGNOSIS — M6281 Muscle weakness (generalized): Secondary | ICD-10-CM | POA: Diagnosis not present

## 2020-09-24 DIAGNOSIS — M799 Soft tissue disorder, unspecified: Secondary | ICD-10-CM | POA: Diagnosis not present

## 2020-09-24 DIAGNOSIS — M546 Pain in thoracic spine: Secondary | ICD-10-CM | POA: Diagnosis not present

## 2020-09-25 DIAGNOSIS — M799 Soft tissue disorder, unspecified: Secondary | ICD-10-CM | POA: Diagnosis not present

## 2020-09-25 DIAGNOSIS — R109 Unspecified abdominal pain: Secondary | ICD-10-CM | POA: Diagnosis not present

## 2020-09-25 DIAGNOSIS — M6281 Muscle weakness (generalized): Secondary | ICD-10-CM | POA: Diagnosis not present

## 2020-09-25 DIAGNOSIS — M546 Pain in thoracic spine: Secondary | ICD-10-CM | POA: Diagnosis not present

## 2020-09-29 DIAGNOSIS — M6281 Muscle weakness (generalized): Secondary | ICD-10-CM | POA: Diagnosis not present

## 2020-09-29 DIAGNOSIS — R109 Unspecified abdominal pain: Secondary | ICD-10-CM | POA: Diagnosis not present

## 2020-09-29 DIAGNOSIS — M799 Soft tissue disorder, unspecified: Secondary | ICD-10-CM | POA: Diagnosis not present

## 2020-09-29 DIAGNOSIS — M546 Pain in thoracic spine: Secondary | ICD-10-CM | POA: Diagnosis not present

## 2020-10-01 DIAGNOSIS — M546 Pain in thoracic spine: Secondary | ICD-10-CM | POA: Diagnosis not present

## 2020-10-01 DIAGNOSIS — R109 Unspecified abdominal pain: Secondary | ICD-10-CM | POA: Diagnosis not present

## 2020-10-01 DIAGNOSIS — M6281 Muscle weakness (generalized): Secondary | ICD-10-CM | POA: Diagnosis not present

## 2020-10-01 DIAGNOSIS — M799 Soft tissue disorder, unspecified: Secondary | ICD-10-CM | POA: Diagnosis not present

## 2020-10-09 DIAGNOSIS — R109 Unspecified abdominal pain: Secondary | ICD-10-CM | POA: Diagnosis not present

## 2020-10-09 DIAGNOSIS — M546 Pain in thoracic spine: Secondary | ICD-10-CM | POA: Diagnosis not present

## 2020-10-09 DIAGNOSIS — M799 Soft tissue disorder, unspecified: Secondary | ICD-10-CM | POA: Diagnosis not present

## 2020-10-09 DIAGNOSIS — M6281 Muscle weakness (generalized): Secondary | ICD-10-CM | POA: Diagnosis not present

## 2020-10-10 DIAGNOSIS — M799 Soft tissue disorder, unspecified: Secondary | ICD-10-CM | POA: Diagnosis not present

## 2020-10-10 DIAGNOSIS — M6281 Muscle weakness (generalized): Secondary | ICD-10-CM | POA: Diagnosis not present

## 2020-10-10 DIAGNOSIS — R109 Unspecified abdominal pain: Secondary | ICD-10-CM | POA: Diagnosis not present

## 2020-10-10 DIAGNOSIS — M546 Pain in thoracic spine: Secondary | ICD-10-CM | POA: Diagnosis not present

## 2020-10-16 ENCOUNTER — Other Ambulatory Visit: Payer: Medicare Other

## 2020-10-16 DIAGNOSIS — Z20822 Contact with and (suspected) exposure to covid-19: Secondary | ICD-10-CM | POA: Diagnosis not present

## 2020-10-19 LAB — NOVEL CORONAVIRUS, NAA: SARS-CoV-2, NAA: DETECTED — AB

## 2020-10-28 DIAGNOSIS — R109 Unspecified abdominal pain: Secondary | ICD-10-CM | POA: Diagnosis not present

## 2020-10-28 DIAGNOSIS — M546 Pain in thoracic spine: Secondary | ICD-10-CM | POA: Diagnosis not present

## 2020-10-28 DIAGNOSIS — M799 Soft tissue disorder, unspecified: Secondary | ICD-10-CM | POA: Diagnosis not present

## 2020-10-28 DIAGNOSIS — M6281 Muscle weakness (generalized): Secondary | ICD-10-CM | POA: Diagnosis not present

## 2020-10-30 DIAGNOSIS — R109 Unspecified abdominal pain: Secondary | ICD-10-CM | POA: Diagnosis not present

## 2020-10-30 DIAGNOSIS — M6281 Muscle weakness (generalized): Secondary | ICD-10-CM | POA: Diagnosis not present

## 2020-10-30 DIAGNOSIS — M799 Soft tissue disorder, unspecified: Secondary | ICD-10-CM | POA: Diagnosis not present

## 2020-10-30 DIAGNOSIS — M546 Pain in thoracic spine: Secondary | ICD-10-CM | POA: Diagnosis not present

## 2020-11-05 DIAGNOSIS — M6281 Muscle weakness (generalized): Secondary | ICD-10-CM | POA: Diagnosis not present

## 2020-11-05 DIAGNOSIS — R109 Unspecified abdominal pain: Secondary | ICD-10-CM | POA: Diagnosis not present

## 2020-11-05 DIAGNOSIS — M799 Soft tissue disorder, unspecified: Secondary | ICD-10-CM | POA: Diagnosis not present

## 2020-11-05 DIAGNOSIS — M546 Pain in thoracic spine: Secondary | ICD-10-CM | POA: Diagnosis not present

## 2020-11-07 DIAGNOSIS — M6281 Muscle weakness (generalized): Secondary | ICD-10-CM | POA: Diagnosis not present

## 2020-11-07 DIAGNOSIS — M799 Soft tissue disorder, unspecified: Secondary | ICD-10-CM | POA: Diagnosis not present

## 2020-11-07 DIAGNOSIS — M546 Pain in thoracic spine: Secondary | ICD-10-CM | POA: Diagnosis not present

## 2020-11-07 DIAGNOSIS — R109 Unspecified abdominal pain: Secondary | ICD-10-CM | POA: Diagnosis not present

## 2020-11-11 DIAGNOSIS — M546 Pain in thoracic spine: Secondary | ICD-10-CM | POA: Diagnosis not present

## 2020-11-11 DIAGNOSIS — M799 Soft tissue disorder, unspecified: Secondary | ICD-10-CM | POA: Diagnosis not present

## 2020-11-11 DIAGNOSIS — M6281 Muscle weakness (generalized): Secondary | ICD-10-CM | POA: Diagnosis not present

## 2020-11-11 DIAGNOSIS — R109 Unspecified abdominal pain: Secondary | ICD-10-CM | POA: Diagnosis not present

## 2020-11-14 DIAGNOSIS — M799 Soft tissue disorder, unspecified: Secondary | ICD-10-CM | POA: Diagnosis not present

## 2020-11-14 DIAGNOSIS — R109 Unspecified abdominal pain: Secondary | ICD-10-CM | POA: Diagnosis not present

## 2020-11-14 DIAGNOSIS — M546 Pain in thoracic spine: Secondary | ICD-10-CM | POA: Diagnosis not present

## 2020-11-14 DIAGNOSIS — M6281 Muscle weakness (generalized): Secondary | ICD-10-CM | POA: Diagnosis not present

## 2020-12-15 DIAGNOSIS — L905 Scar conditions and fibrosis of skin: Secondary | ICD-10-CM | POA: Diagnosis not present

## 2020-12-15 DIAGNOSIS — Z85828 Personal history of other malignant neoplasm of skin: Secondary | ICD-10-CM | POA: Diagnosis not present

## 2020-12-18 ENCOUNTER — Encounter: Payer: Self-pay | Admitting: Family Medicine

## 2021-02-24 ENCOUNTER — Other Ambulatory Visit: Payer: Self-pay | Admitting: Family Medicine

## 2021-02-24 DIAGNOSIS — Z1231 Encounter for screening mammogram for malignant neoplasm of breast: Secondary | ICD-10-CM

## 2021-04-14 ENCOUNTER — Ambulatory Visit
Admission: RE | Admit: 2021-04-14 | Discharge: 2021-04-14 | Disposition: A | Discharge status: Home / Self Care | Payer: Medicare Other | Source: Ambulatory Visit | Attending: Family Medicine | Admitting: Family Medicine

## 2021-04-14 ENCOUNTER — Ambulatory Visit (INDEPENDENT_AMBULATORY_CARE_PROVIDER_SITE_OTHER): Payer: Medicare Other | Admitting: Family Medicine

## 2021-04-14 ENCOUNTER — Encounter: Payer: Self-pay | Admitting: Family Medicine

## 2021-04-14 ENCOUNTER — Other Ambulatory Visit: Payer: Self-pay

## 2021-04-14 VITALS — BP 136/72 | HR 83 | Ht 61.0 in | Wt 135.6 lb

## 2021-04-14 DIAGNOSIS — Z0000 Encounter for general adult medical examination without abnormal findings: Secondary | ICD-10-CM | POA: Diagnosis not present

## 2021-04-14 DIAGNOSIS — Z6825 Body mass index (BMI) 25.0-25.9, adult: Secondary | ICD-10-CM

## 2021-04-14 DIAGNOSIS — E559 Vitamin D deficiency, unspecified: Secondary | ICD-10-CM | POA: Diagnosis not present

## 2021-04-14 DIAGNOSIS — Z1231 Encounter for screening mammogram for malignant neoplasm of breast: Secondary | ICD-10-CM | POA: Diagnosis not present

## 2021-04-14 DIAGNOSIS — R251 Tremor, unspecified: Secondary | ICD-10-CM | POA: Diagnosis not present

## 2021-04-14 DIAGNOSIS — E7800 Pure hypercholesterolemia, unspecified: Secondary | ICD-10-CM | POA: Diagnosis not present

## 2021-04-14 NOTE — Progress Notes (Signed)
Office Visit Note   Patient: Rachel [name]           Date of Birth: 15-Jun-1951           MRN: [id number] Visit Date: 04/14/2021 Requested by: No referring provider defined for this encounter. PCP: Patient, No Pcp Per (Inactive)  Subjective: Chief Complaint  Patient presents with   Medicare Wellness    "My head shakes" -- getting worse. Wants to know if there is anything to do for that. Interested in checking her cholesterol, as she has been on a keto diet.    HPI: She is here for a wellness exam.  Overall doing well.  Her main concern is a head tremor.  This has been worse over the past year.  There may be a family history of tremor, no history of Parkinson's disease to her knowledge.  Occasionally she notices it in her right arm but primarily it is her head and it is usually when she is focused on doing something rather than when relaxed and at rest.  She has been doing well with weight loss on a ketogenic diet.  She started slipping somewhat and has gained some weight, she wants to be below 130 pounds because that is where she feels best.  She has a history of squamous cell and basal cell cancers and sees her dermatologist on a regular basis.  Family history is positive for longevity.  She is up-to-date on eye exams, dental exams, GYN/mammograms.  She has not had a colonoscopy.                ROS:   All other systems were reviewed and are negative.  Objective: Vital Signs: BP 136/72 (BP Location: Left Arm, Patient Position: Sitting, Cuff Size: Normal)   Pulse 83   Ht 5\' 1"  (1.549 m)   Wt 135 lb 9.6 oz (61.5 kg)   BMI 25.62 kg/m   Physical Exam:  General:  Alert and oriented, in no acute distress. Pulm:  Breathing unlabored. Psy:  Normal mood, congruent affect. Skin: No suspicious lesions HEENT:  [place]/AT, PERRLA, EOM Full, no nystagmus.  Funduscopic examination within normal limits.  No conjunctival erythema.  Tympanic membranes are pearly gray with normal landmarks.   External ear canals are normal.  Nasal passages are clear.  Oropharynx is clear.  No significant lymphadenopathy.  No thyromegaly or nodules.  2+ carotid pulses without bruits. CV: Regular rate and rhythm without murmurs, rubs, or gallops.  No peripheral edema.  2+ radial and posterior tibial pulses. Lungs: Clear to auscultation throughout with no wheezing or areas of consolidation. Abd: Bowel sounds are active, no hepatosplenomegaly or masses.  Soft and nontender.  No audible bruits.  No evidence of ascites. Neuro: When supine and relaxed, she does not have a head tremor but she does have 1 when sitting and walking.   Imaging: No results found.  Assessment & Plan: Wellness examination -Labs to evaluate.  Cologuard ordered.  2.  Probable essential tremor -Labs to look for modifiable problems. -Could contemplate treatment with beta-blocker if needed.  3.  We will screen for vitamin D deficiency     Procedures: No procedures performed        PMFS History: There are no problems to display for this patient.  History reviewed. No pertinent past medical history.  Family History  Problem Relation Age of Onset   Hypertension Mother    Tremor Mother    Thyroid disease Mother    Cancer  Father    Liver cancer Father    Parkinson's disease Neg Hx    Heart attack Neg Hx    Stroke Neg Hx     Past Surgical History:  Procedure Laterality Date   ABDOMINAL HYSTERECTOMY     CHOLECYSTECTOMY N/A 01/17/2015   Procedure: LAPAROSCOPIC CHOLECYSTECTOMY;  Surgeon: Aviva Signs Md, MD;  Location: [facility];  Service: General;  Laterality: N/A;   TONSILLECTOMY     TUBAL LIGATION     Social History   Occupational History   Not on file  Tobacco Use   Smoking status: Never   Smokeless tobacco: Not on file  Substance and Sexual Activity   Alcohol use: No   Drug use: No   Sexual activity: Never    Birth control/protection: Surgical

## 2021-04-15 ENCOUNTER — Telehealth: Payer: Self-pay | Admitting: Family Medicine

## 2021-04-15 LAB — CBC WITH DIFFERENTIAL/PLATELET
Absolute Monocytes: 237 cells/uL (ref 200–950)
Basophils Absolute: 0 cells/uL (ref 0–200)
Basophils Relative: 0 %
Eosinophils Absolute: 11 cells/uL — ABNORMAL LOW (ref 15–500)
Eosinophils Relative: 0.3 %
HCT: 49 % — ABNORMAL HIGH (ref 35.0–45.0)
Hemoglobin: 15.8 g/dL — ABNORMAL HIGH (ref 11.7–15.5)
Lymphs Abs: 1147 cells/uL (ref 850–3900)
MCH: 29.6 pg (ref 27.0–33.0)
MCHC: 32.2 g/dL (ref 32.0–36.0)
MCV: 91.9 fL (ref 80.0–100.0)
MPV: 9.9 fL (ref 7.5–12.5)
Monocytes Relative: 6.4 %
Neutro Abs: 2305 cells/uL (ref 1500–7800)
Neutrophils Relative %: 62.3 %
Platelets: 204 10*3/uL (ref 140–400)
RBC: 5.33 10*6/uL — ABNORMAL HIGH (ref 3.80–5.10)
RDW: 12.7 % (ref 11.0–15.0)
Total Lymphocyte: 31 %
WBC: 3.7 10*3/uL — ABNORMAL LOW (ref 3.8–10.8)

## 2021-04-15 LAB — COMPREHENSIVE METABOLIC PANEL
AG Ratio: 1.7 (calc) (ref 1.0–2.5)
ALT: 13 U/L (ref 6–29)
AST: 16 U/L (ref 10–35)
Albumin: 4.4 g/dL (ref 3.6–5.1)
Alkaline phosphatase (APISO): 40 U/L (ref 37–153)
BUN: 15 mg/dL (ref 7–25)
CO2: 27 mmol/L (ref 20–32)
Calcium: 9.4 mg/dL (ref 8.6–10.4)
Chloride: 104 mmol/L (ref 98–110)
Creat: 0.68 mg/dL (ref 0.60–1.00)
Globulin: 2.6 g/dL (calc) (ref 1.9–3.7)
Glucose, Bld: 92 mg/dL (ref 65–99)
Potassium: 4.8 mmol/L (ref 3.5–5.3)
Sodium: 141 mmol/L (ref 135–146)
Total Bilirubin: 0.6 mg/dL (ref 0.2–1.2)
Total Protein: 7 g/dL (ref 6.1–8.1)

## 2021-04-15 LAB — LIPID PANEL
Cholesterol: 240 mg/dL — ABNORMAL HIGH (ref <200)
HDL: 86 mg/dL (ref >=50)
LDL Cholesterol (Calc): 140 mg/dL (calc) — ABNORMAL HIGH
Non-HDL Cholesterol (Calc): 154 mg/dL (calc) — ABNORMAL HIGH (ref <130)
Total CHOL/HDL Ratio: 2.8 (calc) (ref <5.0)
Triglycerides: 46 mg/dL (ref <150)

## 2021-04-15 LAB — THYROID PEROXIDASE ANTIBODY: Thyroperoxidase Ab SerPl-aCnc: 1 IU/mL (ref <9)

## 2021-04-15 LAB — THYROID PANEL WITH TSH
Free Thyroxine Index: 2.5 (ref 1.4–3.8)
T3 Uptake: 27 % (ref 22–35)
T4, Total: 9.2 ug/dL (ref 5.1–11.9)
TSH: 2.1 mIU/L (ref 0.40–4.50)

## 2021-04-15 LAB — VITAMIN D 25 HYDROXY (VIT D DEFICIENCY, FRACTURES): Vit D, 25-Hydroxy: 36 ng/mL (ref 30–100)

## 2021-04-15 NOTE — Telephone Encounter (Signed)
Labs are notable for the following:  Hemoglobin and hematocrit are elevated.  They were elevated 6 years ago as well.  This may be nothing to worry about, but it could be polycythemia.  I suggest rechecking in 1 to 2 months.  If still elevated, then consider referral to a hematologist.  Vitamin D level is acceptable at 36.  Ideal level would be 50-80.  You might consider taking vitamin D3 at 2000 IU daily.  Lipid panel shows elevated total and LDL cholesterol values but HDL and triglycerides look excellent.  It is important to maintain a regular exercise regimen and to minimize dietary intake of processed carbohydrates and sweets.  This will help decrease cardiac risk.  Recheck in 6 to 12 months.  Metabolic panel looks good.  Thyroid studies and antibodies were normal.

## 2021-04-17 ENCOUNTER — Telehealth: Payer: Self-pay | Admitting: Family Medicine

## 2021-04-17 NOTE — Telephone Encounter (Signed)
Mammogram was normal.   

## 2021-05-25 ENCOUNTER — Ambulatory Visit: Payer: Medicare Other

## 2021-05-26 ENCOUNTER — Ambulatory Visit (INDEPENDENT_AMBULATORY_CARE_PROVIDER_SITE_OTHER): Payer: Medicare Other

## 2021-05-26 ENCOUNTER — Other Ambulatory Visit: Payer: Self-pay

## 2021-05-26 DIAGNOSIS — R7989 Other specified abnormal findings of blood chemistry: Secondary | ICD-10-CM

## 2021-05-26 LAB — CBC WITH DIFFERENTIAL/PLATELET
Absolute Monocytes: 269 cells/uL (ref 200–950)
Basophils Absolute: 8 cells/uL (ref 0–200)
Basophils Relative: 0.2 %
Eosinophils Absolute: 29 cells/uL (ref 15–500)
Eosinophils Relative: 0.7 %
HCT: 47.5 % — ABNORMAL HIGH (ref 35.0–45.0)
Hemoglobin: 15.8 g/dL — ABNORMAL HIGH (ref 11.7–15.5)
Lymphs Abs: 1525 cells/uL (ref 850–3900)
MCH: 30.2 pg (ref 27.0–33.0)
MCHC: 33.3 g/dL (ref 32.0–36.0)
MCV: 90.8 fL (ref 80.0–100.0)
MPV: 9.9 fL (ref 7.5–12.5)
Monocytes Relative: 6.4 %
Neutro Abs: 2369 cells/uL (ref 1500–7800)
Neutrophils Relative %: 56.4 %
Platelets: 208 10*3/uL (ref 140–400)
RBC: 5.23 10*6/uL — ABNORMAL HIGH (ref 3.80–5.10)
RDW: 12.5 % (ref 11.0–15.0)
Total Lymphocyte: 36.3 %
WBC: 4.2 10*3/uL (ref 3.8–10.8)

## 2021-05-26 NOTE — Progress Notes (Signed)
Lab visit: repeat CBC per Dr. Junius Roads (abnormal findings on blood work done in July).

## 2021-05-27 ENCOUNTER — Telehealth: Payer: Self-pay | Admitting: Family Medicine

## 2021-05-27 DIAGNOSIS — R7989 Other specified abnormal findings of blood chemistry: Secondary | ICD-10-CM

## 2021-05-27 NOTE — Telephone Encounter (Signed)
CBC continues to show slightly elevated hemoglobin and hematocrit and RBC levels.  This is probably nothing to worry about, but I am going to request referral to a hematologist for reassurance.

## 2021-05-28 ENCOUNTER — Encounter (HOSPITAL_COMMUNITY): Payer: Self-pay

## 2021-05-28 ENCOUNTER — Other Ambulatory Visit: Payer: Self-pay

## 2021-05-29 ENCOUNTER — Inpatient Hospital Stay (HOSPITAL_COMMUNITY): Payer: Medicare Other

## 2021-05-29 ENCOUNTER — Encounter (HOSPITAL_COMMUNITY): Payer: Self-pay | Admitting: Hematology and Oncology

## 2021-05-29 ENCOUNTER — Inpatient Hospital Stay (HOSPITAL_COMMUNITY): Payer: Medicare Other | Attending: Hematology and Oncology | Admitting: Hematology and Oncology

## 2021-05-29 VITALS — BP 134/87 | HR 92 | Temp 98.6°F | Resp 16 | Ht 60.83 in | Wt 137.6 lb

## 2021-05-29 DIAGNOSIS — D751 Secondary polycythemia: Secondary | ICD-10-CM

## 2021-05-29 LAB — CBC WITH DIFFERENTIAL/PLATELET
Abs Immature Granulocytes: 0.01 10*3/uL (ref 0.00–0.07)
Basophils Absolute: 0 10*3/uL (ref 0.0–0.1)
Basophils Relative: 0 %
Eosinophils Absolute: 0 10*3/uL (ref 0.0–0.5)
Eosinophils Relative: 1 %
HCT: 49.1 % — ABNORMAL HIGH (ref 36.0–46.0)
Hemoglobin: 15.6 g/dL — ABNORMAL HIGH (ref 12.0–15.0)
Immature Granulocytes: 0 %
Lymphocytes Relative: 31 %
Lymphs Abs: 1.4 10*3/uL (ref 0.7–4.0)
MCH: 30 pg (ref 26.0–34.0)
MCHC: 31.8 g/dL (ref 30.0–36.0)
MCV: 94.4 fL (ref 80.0–100.0)
Monocytes Absolute: 0.3 10*3/uL (ref 0.1–1.0)
Monocytes Relative: 6 %
Neutro Abs: 2.8 10*3/uL (ref 1.7–7.7)
Neutrophils Relative %: 62 %
Platelets: 212 10*3/uL (ref 150–400)
RBC: 5.2 MIL/uL — ABNORMAL HIGH (ref 3.87–5.11)
RDW: 12.5 % (ref 11.5–15.5)
WBC: 4.5 10*3/uL (ref 4.0–10.5)
nRBC: 0 % (ref 0.0–0.2)

## 2021-05-29 NOTE — Progress Notes (Signed)
[name] CONSULT NOTE  Patient Care Team: Patient, No Pcp Per (Inactive) as PCP - General (General Practice)  CHIEF COMPLAINTS/PURPOSE OF CONSULTATION:  Polycythemia  ASSESSMENT & PLAN:   This is a very pleasant 70 year old female patient with no significant past medical history referred to hematology for evaluation of persistent polycythemia.  I discussed with the patient extensively the differential diagnosis of polycythemia  1. Primary polycythemia due to clonal stem cell abnormality 2. Secondary polycythemia due to cause that include hypoxia, heart or lung problems, altitude, athletics, erythropoietin producing lesion/tumors etc  Recommendation:  1. JAK-2 mutation testing to evaluate polycythemia vera 2. Erythropoietin level 3. Consider sleep apnea testing  Indications for phlebotomy  1. Primary polycythemia with hematocrit over 45 2. Secondary polycythemia with severe symptoms which include strokelike symptoms, severe recurrent headaches, severe fatigue or if HCT is greater than 54.  If the JAK-2 mutation is normal and erythropoietin level is normal, a bone marrow biopsy may be considered. If the erythropoietin is elevated, She will need ultrasound of the liver and kidney for further evaluation.   She expresses understanding of the recommendations and will return to clinic in 4 weeks.  HISTORY OF PRESENTING ILLNESS:  Rachel [name] 70 y.o. female is here because of polycythemia  This is a very pleasant 70 year old female patient with no significant past medical history referred for evaluation of polycythemia.  Patient is a very good historian.  She does remember having polycythemia in the past.  She denies any new health complaints however.  No B symptoms.  She was hoping to lose weight.  No history of thromboembolic episodes.  No erythromelalgia or intractable pruritus.  No personal history of malignancies except skin cancer. She is otherwise healthy for  her age, tries to stay active, retired.  Never smoker. No known diagnosis of sleep apnea although she wonders if she may have some sleep abnormalities.  She does not feel refreshed all the time when she wakes up.  No history of cardiac or pulmonary diseases.  No hormonal supplementation.  She has however noticed some hot flashes in the past year which are sporadic. Rest of the pertinent 10 point ROS reviewed and negative.  REVIEW OF SYSTEMS:   Constitutional: Denies fevers, chills or abnormal night sweats Eyes: Denies blurriness of vision, double vision or watery eyes Ears, nose, mouth, throat, and face: Denies mucositis or sore throat Respiratory: Denies cough, dyspnea or wheezes Cardiovascular: Denies palpitation, chest discomfort or lower extremity swelling Gastrointestinal:  Denies nausea, heartburn or change in bowel habits Skin: Denies abnormal skin rashes Lymphatics: Denies new lymphadenopathy or easy bruising Neurological:Denies numbness, tingling or new weaknesses Behavioral/Psych: Mood is stable, no new changes  All other systems were reviewed with the patient and are negative.  MEDICAL HISTORY:  No past medical history on file.  SURGICAL HISTORY: Past Surgical History:  Procedure Laterality Date   ABDOMINAL HYSTERECTOMY     CHOLECYSTECTOMY N/A 01/17/2015   Procedure: LAPAROSCOPIC CHOLECYSTECTOMY;  Surgeon: Aviva Signs Md, MD;  Location: [facility];  Service: General;  Laterality: N/A;   MOHS SURGERY  07/2020   Dr. Winifred Olive   MOUTH SURGERY  03/2020   mass in left side at top- [place] HISTORY: Social History   Socioeconomic History   Marital status: Widowed    Spouse name: Not on file   Number of children: Not on file   Years of education: Not  on file   Highest education level: Not on file  Occupational History   Not on file  Tobacco Use   Smoking status: Never   Smokeless tobacco: Never  Vaping Use   Vaping Use:  Never used  Substance and Sexual Activity   Alcohol use: No   Drug use: No   Sexual activity: Never    Birth control/protection: Surgical  Other Topics Concern   Not on file  Social History Narrative   Not on file   Social Determinants of Health   Financial Resource Strain: Low Risk    Difficulty of Paying Living Expenses: Not hard at all  Food Insecurity: No Food Insecurity   Worried About Charity fundraiser in the Last Year: Never true   [place] in the Last Year: Never true  Transportation Needs: No Transportation Needs   Lack of Transportation (Medical): No   Lack of Transportation (Non-Medical): No  Physical Activity: Sufficiently Active   Days of Exercise per Week: 3 days   Minutes of Exercise per Session: 60 min  Stress: No Stress Concern Present   Feeling of Stress : Not at all  Social Connections: Moderately Integrated   Frequency of Communication with Friends and Family: More than three times a week   Frequency of Social Gatherings with Friends and Family: More than three times a week   Attends Religious Services: More than 4 times per year   Active Member of [employer] or Organizations: Yes   Attends Archivist Meetings: More than 4 times per year   Marital Status: Widowed  Human resources officer Violence: Not At Risk   Fear of Current or Ex-Partner: No   Emotionally Abused: No   Physically Abused: No   Sexually Abused: No    FAMILY HISTORY: Family History  Problem Relation Age of Onset   Hypertension Mother    Tremor Mother    Thyroid disease Mother    Cancer Father    Liver cancer Father    Alzheimer's disease Father    Diabetes Paternal Aunt    Heart disease Paternal Aunt    Brain cancer Paternal Uncle    Parkinson's disease Neg Hx    Heart attack Neg Hx    Stroke Neg Hx     ALLERGIES:  is allergic to caffeine and e-mycin [erythromycin].  MEDICATIONS:  Current Outpatient Medications  Medication Sig Dispense Refill   IBUPROFEN PO Take  1 tablet by mouth as needed.     Probiotic Product (PROBIOTIC DAILY PO) Take by mouth.     No current facility-administered medications for this visit.     PHYSICAL EXAMINATION:  ECOG PERFORMANCE STATUS: 0 - Asymptomatic  Vitals:   05/29/21 0859  BP: 134/87  Pulse: 92  Resp: 16  Temp: 98.6 F (37 C)  SpO2: 95%   Filed Weights   05/29/21 0859  Weight: 137 lb 9.1 oz (62.4 kg)    GENERAL:alert, no distress and comfortable SKIN: skin color, texture, turgor are normal, no rashes or significant lesions EYES: normal, conjunctiva are pink and non-injected, sclera clear OROPHARYNX:no exudate, no erythema and lips, buccal mucosa, and tongue normal  NECK: supple, thyroid normal size, non-tender, without nodularity LYMPH:  no palpable lymphadenopathy in the cervical, axillary LUNGS: clear to auscultation and percussion with normal breathing effort HEART: regular rate & rhythm and no murmurs and no lower extremity edema ABDOMEN:abdomen soft, non-tender and normal bowel sounds Musculoskeletal:no cyanosis of digits and no clubbing  PSYCH: alert &  oriented x 3 with fluent speech NEURO: no focal motor/sensory deficits  LABORATORY DATA:  I have reviewed the data as listed Lab Results  Component Value Date   WBC 4.2 05/26/2021   HGB 15.8 (H) 05/26/2021   HCT 47.5 (H) 05/26/2021   MCV 90.8 05/26/2021   PLT 208 05/26/2021     Chemistry      Component Value Date/Time   NA 141 04/14/2021 0856   K 4.8 04/14/2021 0856   CL 104 04/14/2021 0856   CO2 27 04/14/2021 0856   BUN 15 04/14/2021 0856   CREATININE 0.68 04/14/2021 0856      Component Value Date/Time   CALCIUM 9.4 04/14/2021 0856   ALKPHOS 51 01/13/2015 0900   AST 16 04/14/2021 0856   ALT 13 04/14/2021 0856   BILITOT 0.6 04/14/2021 0856     I have reviewed all available CBC from her.  She has had persistent polycythemia with hemoglobin around 15.5 g/dL with no major changes since 2016.  No leukocytosis, thrombocytosis or  basophilia noted.  RADIOGRAPHIC STUDIES: I have personally reviewed the radiological images as listed and agreed with the findings in the report. No results found.  All questions were answered. The patient knows to call the clinic with any problems, questions or concerns. I spent 45 minutes in the care of this patient including H and P, review of records, counseling and coordination of care.     Benay Pike, MD 05/29/2021 9:16 AM

## 2021-05-30 LAB — ERYTHROPOIETIN: Erythropoietin: 6.8 m[IU]/mL (ref 2.6–18.5)

## 2021-06-02 DIAGNOSIS — Z6826 Body mass index (BMI) 26.0-26.9, adult: Secondary | ICD-10-CM | POA: Diagnosis not present

## 2021-06-02 DIAGNOSIS — E7800 Pure hypercholesterolemia, unspecified: Secondary | ICD-10-CM | POA: Diagnosis not present

## 2021-06-02 DIAGNOSIS — G4700 Insomnia, unspecified: Secondary | ICD-10-CM | POA: Diagnosis not present

## 2021-06-10 LAB — JAK2 V617F, W REFLEX TO CALR/E12/MPL

## 2021-06-10 LAB — CALR + JAK2 E12-15 + MPL (REFLEXED)

## 2021-06-12 ENCOUNTER — Ambulatory Visit (HOSPITAL_COMMUNITY): Payer: Medicare Other | Admitting: Hematology and Oncology

## 2021-06-16 ENCOUNTER — Other Ambulatory Visit: Payer: Self-pay | Admitting: Internal Medicine

## 2021-06-16 DIAGNOSIS — M810 Age-related osteoporosis without current pathological fracture: Secondary | ICD-10-CM

## 2021-06-25 NOTE — Progress Notes (Signed)
[place], [place] [address]   CLINIC:  Medical Oncology/Hematology  PCP:  Patient, No Pcp Per (Inactive) No address on file None   REASON FOR VISIT:  Follow-up for erythrocytosis  PRIOR THERAPY: None  CURRENT THERAPY: Under work-up  INTERVAL HISTORY:  Rachel [name] 70 y.o. female returns for routine follow-up of erythrocytosis.  She was seen by Dr. Chryl Heck for initial consultation on 05/29/2021.  At today's visit, she reports feeling well.  No recent hospitalizations, surgeries, or changes in baseline health status.  She denies any erythromelalgia, aquagenic pruritus, or vasomotor symptoms such as headache or strokelike symptoms.  No B symptoms such as fever, chills, night sweats, unintentional weight loss.  She does report occasional hot flashes at night, occurring about once per month.  No prior history of blood clots and no current signs or symptoms of blood clot such as unilateral leg swelling, chest pain, dyspnea, hemoptysis.  She is a lifelong non-smoker.  She has no diagnosis of sleep apnea, but does wonder if she may have some underlying sleep abnormalities as she often wakes up without feeling refreshed.  She has no history of cardiac or pulmonary disease.  She does report that she lives in on the second story of an older home with gas appliances and a gas heater in her living space.  She has 80% energy and 80% appetite. She endorses that she is maintaining a stable weight.    REVIEW OF SYSTEMS:  Review of Systems  Constitutional:  Negative for appetite change, chills, diaphoresis, fatigue, fever and unexpected weight change.  HENT:   Negative for lump/mass and nosebleeds.   Eyes:  Negative for eye problems.  Respiratory:  Negative for cough, hemoptysis and shortness of breath.   Cardiovascular:  Negative for chest pain, leg swelling and palpitations.  Gastrointestinal:  Negative for abdominal pain, blood in stool, constipation,  diarrhea, nausea and vomiting.  Genitourinary:  Negative for hematuria.   Skin: Negative.   Neurological:  Negative for dizziness, headaches and light-headedness.  Hematological:  Does not bruise/bleed easily.  Psychiatric/Behavioral:  Positive for sleep disturbance.      PAST MEDICAL/SURGICAL HISTORY:  No past medical history on file. Past Surgical History:  Procedure Laterality Date   ABDOMINAL HYSTERECTOMY     CHOLECYSTECTOMY N/A 01/17/2015   Procedure: LAPAROSCOPIC CHOLECYSTECTOMY;  Surgeon: Aviva Signs Md, MD;  Location: [facility];  Service: General;  Laterality: N/A;   MOHS SURGERY  07/2020   Dr. Winifred Olive   MOUTH SURGERY  03/2020   mass in left side at top- [place] HISTORY:  Social History   Socioeconomic History   Marital status: Widowed    Spouse name: Not on file   Number of children: Not on file   Years of education: Not on file   Highest education level: Not on file  Occupational History   Not on file  Tobacco Use   Smoking status: Never   Smokeless tobacco: Never  Vaping Use   Vaping Use: Never used  Substance and Sexual Activity   Alcohol use: No   Drug use: No   Sexual activity: Never    Birth control/protection: Surgical  Other Topics Concern   Not on file  Social History Narrative   Not on file   Social Determinants of Health   Financial Resource Strain: Low Risk    Difficulty of Paying Living Expenses:  Not hard at all  Food Insecurity: No Food Insecurity   Worried About Charity fundraiser in the Last Year: Never true   Ran Out of Food in the Last Year: Never true  Transportation Needs: No Transportation Needs   Lack of Transportation (Medical): No   Lack of Transportation (Non-Medical): No  Physical Activity: Sufficiently Active   Days of Exercise per Week: 3 days   Minutes of Exercise per Session: 60 min  Stress: No Stress Concern Present   Feeling of Stress : Not at all  Social  Connections: Moderately Integrated   Frequency of Communication with Friends and Family: More than three times a week   Frequency of Social Gatherings with Friends and Family: More than three times a week   Attends Religious Services: More than 4 times per year   Active Member of [employer] or Organizations: Yes   Attends Archivist Meetings: More than 4 times per year   Marital Status: Widowed  Human resources officer Violence: Not At Risk   Fear of Current or Ex-Partner: No   Emotionally Abused: No   Physically Abused: No   Sexually Abused: No    FAMILY HISTORY:  Family History  Problem Relation Age of Onset   Hypertension Mother    Tremor Mother    Thyroid disease Mother    Cancer Father    Liver cancer Father    Alzheimer's disease Father    Diabetes Paternal Aunt    Heart disease Paternal Aunt    Brain cancer Paternal Uncle    Parkinson's disease Neg Hx    Heart attack Neg Hx    Stroke Neg Hx     CURRENT MEDICATIONS:  Outpatient Encounter Medications as of 06/26/2021  Medication Sig   IBUPROFEN PO Take 1 tablet by mouth as needed.   Probiotic Product (PROBIOTIC DAILY PO) Take by mouth.   No facility-administered encounter medications on file as of 06/26/2021.    ALLERGIES:  Allergies  Allergen Reactions   Caffeine     "makes me very jittery"   E-Mycin [Erythromycin] Rash     PHYSICAL EXAM:  ECOG PERFORMANCE STATUS: 0 - Asymptomatic  There were no vitals filed for this visit. There were no vitals filed for this visit. Physical Exam Constitutional:      Appearance: Normal appearance.  HENT:     Head: Normocephalic and atraumatic.     Mouth/Throat:     Mouth: Mucous membranes are moist.  Eyes:     Extraocular Movements: Extraocular movements intact.     Pupils: Pupils are equal, round, and reactive to light.  Cardiovascular:     Rate and Rhythm: Normal rate and regular rhythm.     Pulses: Normal pulses.     Heart sounds: Normal heart sounds.   Pulmonary:     Effort: Pulmonary effort is normal.     Breath sounds: Normal breath sounds.  Abdominal:     General: Bowel sounds are normal.     Palpations: Abdomen is soft.     Tenderness: There is no abdominal tenderness.  Musculoskeletal:        General: No swelling.     Right lower leg: No edema.     Left lower leg: No edema.  Lymphadenopathy:     Cervical: No cervical adenopathy.  Skin:    General: Skin is warm and dry.  Neurological:     General: No focal deficit present.     Mental Status: She is alert and oriented  to person, place, and time.  Psychiatric:        Mood and Affect: Mood normal.        Behavior: Behavior normal.     LABORATORY DATA:  I have reviewed the labs as listed.  CBC    Component Value Date/Time   WBC 4.5 05/29/2021 0943   RBC 5.20 (H) 05/29/2021 0943   HGB 15.6 (H) 05/29/2021 0943   HCT 49.1 (H) 05/29/2021 0943   PLT 212 05/29/2021 0943   MCV 94.4 05/29/2021 0943   MCH 30.0 05/29/2021 0943   MCHC 31.8 05/29/2021 0943   RDW 12.5 05/29/2021 0943   LYMPHSABS 1.4 05/29/2021 0943   MONOABS 0.3 05/29/2021 0943   EOSABS 0.0 05/29/2021 0943   BASOSABS 0.0 05/29/2021 0943   CMP Latest Ref Rng & Units 04/14/2021 01/13/2015  Glucose 65 - 99 mg/dL 92 100(H)  BUN 7 - 25 mg/dL 15 20  Creatinine 0.60 - 1.00 mg/dL 0.68 0.69  Sodium 135 - 146 mmol/L 141 142  Potassium 3.5 - 5.3 mmol/L 4.8 4.9  Chloride 98 - 110 mmol/L 104 104  CO2 20 - 32 mmol/L 27 32  Calcium 8.6 - 10.4 mg/dL 9.4 9.5  Total Protein 6.1 - 8.1 g/dL 7.0 7.4  Total Bilirubin 0.2 - 1.2 mg/dL 0.6 0.7  Alkaline Phos 39 - 117 U/L - 51  AST 10 - 35 U/L 16 21  ALT 6 - 29 U/L 13 17    DIAGNOSTIC IMAGING:  I have independently reviewed the relevant imaging and discussed with the patient.  ASSESSMENT & PLAN: 1.  Erythrocytosis - She has had persistently elevated hemoglobin/hematocrit since at least 2016, stable within baseline range of Hgb 15.0-16.0 and HCT 47.5-49.0 - Lifelong  non-smoker.  No history of cardiac or pulmonary disease. - May have some underlying sleep disorder as she reports that she does not feel rested when she wakes from sleep - She lives in an older home with gas appliances - No history of blood clots  - She is currently asymptomatic and without erythromelalgia or vasomotor symptoms - Laboratory work-up (05/29/2021) showed normal erythropoietin 6.8, negative JAK2 V617F, CALR, MPL, and JAK2 E12-15 - Most recent CBC (05/29/2021): Hgb 15.6 with hematocrit 49.1 -Case discussed with patient that indication for phlebotomy in case of secondary polycythemia would be if she began to have severe symptoms which include strokelike symptoms, severe recurrent headaches, severe fatigue or if HCT is greater than 54. - PLAN: We will check carboxyhemoglobin and carbon monoxide levels to rule out occult carbon monoxide poisoning.  Will refer for sleep study to rule out OSA.  No indication for phlebotomy or cytoreductive treatment at this time.  Phone visit in 1 to 2 weeks to discuss results.   PLAN SUMMARY & DISPOSITION: - Labs today (carbon oxide levels and carboxyhemoglobin - Phone visit in 1 to 2 weeks **(After phone visit, will consider having patient RTC in 3 months for repeat labs and follow-up visit.  If she has any significant deviations from baseline or worsening of her blood counts, we would consider bone marrow biopsy in the future).  All questions were answered. The patient knows to call the clinic with any problems, questions or concerns.  Medical decision making: Low  Time spent on visit: I spent 15 minutes counseling the patient face to face. The total time spent in the appointment was 25 minutes and more than 50% was on counseling.   Harriett Rush, PA-C  06/26/2021 10:26 AM

## 2021-06-26 ENCOUNTER — Other Ambulatory Visit: Payer: Self-pay

## 2021-06-26 ENCOUNTER — Inpatient Hospital Stay (HOSPITAL_COMMUNITY): Payer: Medicare Other

## 2021-06-26 ENCOUNTER — Inpatient Hospital Stay (HOSPITAL_COMMUNITY): Payer: Medicare Other | Attending: Hematology and Oncology | Admitting: Physician Assistant

## 2021-06-26 VITALS — BP 144/72 | HR 78 | Temp 98.1°F | Resp 16 | Wt 139.0 lb

## 2021-06-26 DIAGNOSIS — Z90710 Acquired absence of both cervix and uterus: Secondary | ICD-10-CM | POA: Diagnosis not present

## 2021-06-26 DIAGNOSIS — R232 Flushing: Secondary | ICD-10-CM | POA: Insufficient documentation

## 2021-06-26 DIAGNOSIS — Z791 Long term (current) use of non-steroidal anti-inflammatories (NSAID): Secondary | ICD-10-CM | POA: Insufficient documentation

## 2021-06-26 DIAGNOSIS — D751 Secondary polycythemia: Secondary | ICD-10-CM | POA: Insufficient documentation

## 2021-06-26 LAB — CARBOXYHEMOGLOBIN - COOX: Carboxyhemoglobin: 0.7 % (ref 0.5–1.5)

## 2021-06-26 NOTE — Patient Instructions (Signed)
[place] [facility] Discharge Instructions  You were seen today by Tarri Abernethy PA-C for your erythrocytosis (elevated red blood cells).  Your red blood cells continue to remain elevated, but we do not yet know the cause of this.  The testing for genetic mutations was negative, meaning that you do not have any genetic mutations causing your elevated red blood cells.  I would like you to get a sleep study performed to see if you have any underlying obstructive sleep apnea that could be causing elevated red blood cells.  I will also check carbon monoxide labs to make sure you do not have any low levels of carbon monoxide poisoning.  We will schedule a phone visit in the next 1 to 2 weeks to discuss the results of the carbon monoxide labs.  LABS: Labs today before leaving the hospital  OTHER TESTS: Sleep study (referral has been sent)  MEDICATIONS: No changes to home medications  FOLLOW-UP APPOINTMENT: Phone visit in 1 to 2 weeks   Thank you for choosing [facility] at [facility] to provide your oncology and hematology care.  To afford each patient quality time with our provider, please arrive at least 15 minutes before your scheduled appointment time.   If you have a lab appointment with the [facility] please come in thru the Main Entrance and check in at the main information desk.  You need to re-schedule your appointment should you arrive 10 or more minutes late.  We strive to give you quality time with our providers, and arriving late affects you and other patients whose appointments are after yours.  Also, if you no show three or more times for appointments you may be dismissed from the clinic at the providers discretion.     Again, thank you for choosing [facility].  Our hope is that these requests will decrease the amount of time that you wait before being seen by our physicians.        _____________________________________________________________  Should you have questions after your visit to [facility], please contact our office at [phone] and follow the prompts.  Our office hours are 8:00 a.m. and 4:30 p.m. Monday - Friday.  Please note that voicemails left after 4:00 p.m. may not be returned until the following business day.  We are closed weekends and major holidays.  You do have access to a nurse 24-7, just call the main number to the clinic [phone] and do not press any options, hold on the line and a nurse will answer the phone.    For prescription refill requests, have your pharmacy contact our office and allow 72 hours.    Due to Covid, you will need to wear a mask upon entering the hospital. If you do not have a mask, a mask will be given to you at the Main Entrance upon arrival. For doctor visits, patients may have 1 support person age 47 or older with them. For treatment visits, patients can not have anyone with them due to social distancing guidelines and our immunocompromised population.

## 2021-06-29 LAB — CARBON MONOXIDE, BLOOD (PERFORMED AT REF LAB): Carbon Monoxide, Blood: 3.2 % (ref 0.0–3.6)

## 2021-07-08 NOTE — Progress Notes (Signed)
Virtual Visit via Telephone Note [facility]  I connected with Rachel [name]  on 07/09/21  at  8:25 AM by telephone and verified that I am speaking with the correct person using two identifiers.  Location: Patient: Home Provider: [facility]   I discussed the limitations, risks, security and privacy concerns of performing an evaluation and management service by telephone and the availability of in person appointments. I also discussed with the patient that there may be a patient responsible charge related to this service. The patient expressed understanding and agreed to proceed.   HISTORY OF PRESENT ILLNESS: Rachel [name] is followed in our clinic for erythrocytosis which is still under work-up.  She was last seen by Tarri Abernethy PA-C on 06/26/2021.  She returns today to discuss lab results and next steps.  She denies any changes in her symptoms or health status since her last visit. No erythromelalgia, aquagenic pruritus, vasomotor symptoms, strokelike symptoms, or B symptoms. No signs or symptoms of blood clot since last visit.    OBSERVATIONS/OBJECTIVE: Review of Systems  Constitutional:  Positive for malaise/fatigue (energy 70%). Negative for chills, diaphoresis, fever and weight loss.  Respiratory:  Negative for cough and shortness of breath.   Cardiovascular:  Negative for chest pain and palpitations.  Gastrointestinal:  Negative for abdominal pain, blood in stool, melena, nausea and vomiting.  Neurological:  Negative for dizziness and headaches.  Psychiatric/Behavioral:  The patient has insomnia (difficulty falling and staying asleep).     PHYSICAL EXAM (per limitations of virtual telephone visit): The patient is alert and oriented x 3, exhibiting adequate mentation, good mood, and ability to speak in full sentences and execute sound judgement.   ASSESSMENT & PLAN: 1.  Erythrocytosis - She has had persistently elevated hemoglobin/hematocrit  since at least 2016, stable within baseline range of Hgb 15.0-16.0 and HCT 47.5-49.0 - Lifelong non-smoker.  No history of cardiac or pulmonary disease. - May have some underlying sleep disorder as she reports that she does not feel rested when she wakes from sleep - She lives in an older home with gas appliances - carbon monoxide and carboxyhemoglobin were normal - No history of blood clots  - She is currently asymptomatic and without erythromelalgia or vasomotor symptoms  - Laboratory work-up (05/29/2021) showed normal erythropoietin 6.8, negative JAK2 V617F, CALR, MPL, and JAK2 E12-15 - Most recent CBC (05/29/2021): Hgb 15.6 with hematocrit 49.1  - Case discussed with patient that indication for phlebotomy in case of secondary polycythemia would be if she began to have severe symptoms which include strokelike symptoms, severe recurrent headaches, severe fatigue or if HCT is greater than 54. - PLAN: Since carbon monoxide panel was negative, suspect that she may have erythrocytosis in the setting of possible undiagnosed OSA.  She has been referred for sleep study.  We will repeat labs and follow-up in office in 3 months, or sooner if indicated based on symptoms. - If she has any significant deviations from baseline or worsening of her blood counts, we would consider bone marrow biopsy.   FOLLOW UP INSTRUCTIONS: - Labs and RTC in 3 months - Referral has been sent for sleep study    I discussed the assessment and treatment plan with the patient. The patient was provided an opportunity to ask questions and all were answered. The patient agreed with the plan and demonstrated an understanding of the instructions.   The patient was advised to call back or seek an in-person evaluation if  the symptoms worsen or if the condition fails to improve as anticipated.  I provided 8 minutes of non-face-to-face time during this encounter.   Harriett Rush, PA-C 07/09/21 8:48 AM

## 2021-07-09 ENCOUNTER — Other Ambulatory Visit: Payer: Self-pay

## 2021-07-09 ENCOUNTER — Inpatient Hospital Stay (HOSPITAL_COMMUNITY): Payer: Medicare Other | Attending: Hematology and Oncology | Admitting: Physician Assistant

## 2021-07-09 DIAGNOSIS — D751 Secondary polycythemia: Secondary | ICD-10-CM

## 2021-07-31 DIAGNOSIS — L738 Other specified follicular disorders: Secondary | ICD-10-CM | POA: Diagnosis not present

## 2021-07-31 DIAGNOSIS — Z08 Encounter for follow-up examination after completed treatment for malignant neoplasm: Secondary | ICD-10-CM | POA: Diagnosis not present

## 2021-07-31 DIAGNOSIS — Z85828 Personal history of other malignant neoplasm of skin: Secondary | ICD-10-CM | POA: Diagnosis not present

## 2021-07-31 DIAGNOSIS — L820 Inflamed seborrheic keratosis: Secondary | ICD-10-CM | POA: Diagnosis not present

## 2021-07-31 DIAGNOSIS — L538 Other specified erythematous conditions: Secondary | ICD-10-CM | POA: Diagnosis not present

## 2021-07-31 DIAGNOSIS — L910 Hypertrophic scar: Secondary | ICD-10-CM | POA: Diagnosis not present

## 2021-07-31 DIAGNOSIS — D225 Melanocytic nevi of trunk: Secondary | ICD-10-CM | POA: Diagnosis not present

## 2021-07-31 DIAGNOSIS — L821 Other seborrheic keratosis: Secondary | ICD-10-CM | POA: Diagnosis not present

## 2021-07-31 DIAGNOSIS — L304 Erythema intertrigo: Secondary | ICD-10-CM | POA: Diagnosis not present

## 2021-07-31 DIAGNOSIS — L720 Epidermal cyst: Secondary | ICD-10-CM | POA: Diagnosis not present

## 2021-07-31 DIAGNOSIS — L298 Other pruritus: Secondary | ICD-10-CM | POA: Diagnosis not present

## 2021-07-31 DIAGNOSIS — L814 Other melanin hyperpigmentation: Secondary | ICD-10-CM | POA: Diagnosis not present

## 2021-10-16 ENCOUNTER — Inpatient Hospital Stay (HOSPITAL_COMMUNITY): Payer: Medicare Other

## 2021-10-16 ENCOUNTER — Ambulatory Visit (HOSPITAL_COMMUNITY): Payer: Medicare Other | Admitting: Physician Assistant

## 2021-11-02 NOTE — Progress Notes (Signed)
Rachel [name], Rachel [name] [id number]   CLINIC:  Medical Oncology/Hematology  PCP:  Patient, No Pcp Per (Inactive) No address on file None   REASON FOR VISIT:  Follow-up for erythrocytosis (suspected secondary polycythemia)  PRIOR THERAPY: None  CURRENT THERAPY: Observation  INTERVAL HISTORY:  Rachel [name] 71 y.o. female returns for routine follow-up of erythrocytosis, which is suspected to be secondary polycythemia.  She was last evaluated via telemedicine visit by Rachel Abernethy PA-C on 07/09/2021.  At today's visit, she reports feeling well.  No recent hospitalizations, surgeries, or changes in baseline health status.  She denies any changes in her symptoms or health status since her last visit. No erythromelalgia, aquagenic pruritus, vasomotor symptoms, strokelike symptoms, or B symptoms. No signs or symptoms of blood clot since last visit.  She has 75% energy and 100% appetite. She endorses that she is maintaining a stable weight.   REVIEW OF SYSTEMS:  Review of Systems  Constitutional:  Positive for fatigue (energy 75%). Negative for appetite change, chills, diaphoresis, fever and unexpected weight change.  HENT:   Negative for lump/mass and nosebleeds.   Eyes:  Negative for eye problems.  Respiratory:  Negative for cough, hemoptysis and shortness of breath.   Cardiovascular:  Negative for chest pain, leg swelling and palpitations.  Gastrointestinal:  Negative for abdominal pain, blood in stool, constipation, diarrhea, nausea and vomiting.  Genitourinary:  Negative for hematuria.   Skin: Negative.   Neurological:  Negative for dizziness, headaches and light-headedness.  Hematological:  Does not bruise/bleed easily.  Psychiatric/Behavioral:  Positive for sleep disturbance.      PAST MEDICAL/SURGICAL HISTORY:  No past medical history on file. Past Surgical History:  Procedure Laterality Date   ABDOMINAL HYSTERECTOMY      CHOLECYSTECTOMY N/A 01/17/2015   Procedure: LAPAROSCOPIC CHOLECYSTECTOMY;  Surgeon: Aviva Signs Md, MD;  Location: [facility];  Service: General;  Laterality: N/A;   MOHS SURGERY  07/2020   Dr. Winifred Olive   MOUTH SURGERY  03/2020   mass in left side at top- Drain HISTORY:  Social History   Socioeconomic History   Marital status: Widowed    Spouse name: Not on file   Number of children: Not on file   Years of education: Not on file   Highest education level: Not on file  Occupational History   Not on file  Tobacco Use   Smoking status: Never   Smokeless tobacco: Never  Vaping Use   Vaping Use: Never used  Substance and Sexual Activity   Alcohol use: No   Drug use: No   Sexual activity: Never    Birth control/protection: Surgical  Other Topics Concern   Not on file  Social History Narrative   Not on file   Social Determinants of Health   Financial Resource Strain: Low Risk    Difficulty of Paying Living Expenses: Not hard at all  Food Insecurity: No Food Insecurity   Worried About Charity fundraiser in the Last Year: Never true   [place] in the Last Year: Never true  Transportation Needs: No Transportation Needs   Lack of Transportation (Medical): No   Lack of Transportation (Non-Medical): No  Physical Activity: Sufficiently Active   Days of Exercise per Week: 3 days   Minutes of Exercise per Session: 60 min  Stress: No Stress Concern Present   Feeling  of Stress : Not at all  Social Connections: Moderately Integrated   Frequency of Communication with Friends and Family: More than three times a week   Frequency of Social Gatherings with Friends and Family: More than three times a week   Attends Religious Services: More than 4 times per year   Active Member of [employer] or Organizations: Yes   Attends Archivist Meetings: More than 4 times per year   Marital Status: Widowed  Human resources officer Violence:  Not At Risk   Fear of Current or Ex-Partner: No   Emotionally Abused: No   Physically Abused: No   Sexually Abused: No    FAMILY HISTORY:  Family History  Problem Relation Age of Onset   Hypertension Mother    Tremor Mother    Thyroid disease Mother    Cancer Father    Liver cancer Father    Alzheimer's disease Father    Diabetes Paternal Aunt    Heart disease Paternal Aunt    Brain cancer Paternal Uncle    Parkinson's disease Neg Hx    Heart attack Neg Hx    Stroke Neg Hx     CURRENT MEDICATIONS:  Outpatient Encounter Medications as of 11/03/2021  Medication Sig   IBUPROFEN PO Take 1 tablet by mouth as needed. (Patient not taking: Reported on 07/09/2021)   Probiotic Product (PROBIOTIC DAILY PO) Take by mouth.   No facility-administered encounter medications on file as of 11/03/2021.    ALLERGIES:  Allergies  Allergen Reactions   Caffeine     "makes me very jittery"   E-Mycin [Erythromycin] Rash     PHYSICAL EXAM:  ECOG PERFORMANCE STATUS: 0 - Asymptomatic  There were no vitals filed for this visit. There were no vitals filed for this visit. Physical Exam Constitutional:      Appearance: Normal appearance.  HENT:     Head: Normocephalic and atraumatic.     Mouth/Throat:     Mouth: Mucous membranes are moist.  Eyes:     Extraocular Movements: Extraocular movements intact.     Pupils: Pupils are equal, round, and reactive to light.  Cardiovascular:     Rate and Rhythm: Normal rate and regular rhythm.     Pulses: Normal pulses.     Heart sounds: Normal heart sounds.  Pulmonary:     Effort: Pulmonary effort is normal.     Breath sounds: Normal breath sounds.  Abdominal:     General: Bowel sounds are normal.     Palpations: Abdomen is soft.     Tenderness: There is no abdominal tenderness.  Musculoskeletal:        General: No swelling.     Right lower leg: No edema.     Left lower leg: No edema.  Lymphadenopathy:     Cervical: No cervical adenopathy.   Skin:    General: Skin is warm and dry.  Neurological:     General: No focal deficit present.     Mental Status: She is alert and oriented to person, place, and time.  Psychiatric:        Mood and Affect: Mood normal.        Behavior: Behavior normal.     LABORATORY DATA:  I have reviewed the labs as listed.  CBC    Component Value Date/Time   WBC 4.5 05/29/2021 0943   RBC 5.20 (H) 05/29/2021 0943   HGB 15.6 (H) 05/29/2021 0943   HCT 49.1 (H) 05/29/2021 0943   PLT 212  05/29/2021 0943   MCV 94.4 05/29/2021 0943   MCH 30.0 05/29/2021 0943   MCHC 31.8 05/29/2021 0943   RDW 12.5 05/29/2021 0943   LYMPHSABS 1.4 05/29/2021 0943   MONOABS 0.3 05/29/2021 0943   EOSABS 0.0 05/29/2021 0943   BASOSABS 0.0 05/29/2021 0943   CMP Latest Ref Rng & Units 04/14/2021 01/13/2015  Glucose 65 - 99 mg/dL 92 100(H)  BUN 7 - 25 mg/dL 15 20  Creatinine 0.60 - 1.00 mg/dL 0.68 0.69  Sodium 135 - 146 mmol/L 141 142  Potassium 3.5 - 5.3 mmol/L 4.8 4.9  Chloride 98 - 110 mmol/L 104 104  CO2 20 - 32 mmol/L 27 32  Calcium 8.6 - 10.4 mg/dL 9.4 9.5  Total Protein 6.1 - 8.1 g/dL 7.0 7.4  Total Bilirubin 0.2 - 1.2 mg/dL 0.6 0.7  Alkaline Phos 39 - 117 U/L - 51  AST 10 - 35 U/L 16 21  ALT 6 - 29 U/L 13 17    DIAGNOSTIC IMAGING:  I have independently reviewed the relevant imaging and discussed with the patient.  ASSESSMENT & PLAN: 1.  Erythrocytosis - She has had persistently elevated hemoglobin/hematocrit since at least 2016, stable within baseline range of Hgb 15.0-16.0 and HCT 47.5-49.0 - Lifelong non-smoker.  No history of cardiac or pulmonary disease. - May have some underlying sleep disorder as she reports that she does not feel rested when she wakes from sleep   - She lives in an older home with gas appliances - carbon monoxide and carboxyhemoglobin were normal - No history of blood clots  - She is currently asymptomatic and without erythromelalgia or vasomotor symptoms    - Laboratory  work-up (05/29/2021) showed normal erythropoietin 6.8, negative JAK2 V617F, CALR, MPL, and JAK2 E12-15 - Most recent CBC (11/03/2021): Hgb 15.7 with hematocrit 48.5 - Case discussed with patient that indication for phlebotomy in case of secondary polycythemia would be if she began to have severe symptoms which include strokelike symptoms, severe recurrent headaches, severe fatigue or if HCT is greater than 54. - PLAN: Since carbon monoxide panel was negative, suspect that she may have erythrocytosis in the setting of possible undiagnosed OSA.  She has been referred for sleep study.  We will repeat labs and follow-up in office in 6 months, or sooner if indicated based on symptoms. - If she has any significant deviations from baseline or worsening of her blood counts, we would consider bone marrow biopsy. - No indication for aspirin at this time, as she does not have any other cardiac risk factors such as hypertension or diabetes   PLAN SUMMARY & DISPOSITION: Continuing to await sleep study Repeat labs and RTC in 6 months  All questions were answered. The patient knows to call the clinic with any problems, questions or concerns.  Medical decision making: Low  Time spent on visit: I spent 15 minutes counseling the patient face to face. The total time spent in the appointment was 20 minutes and more than 50% was on counseling.   Harriett Rush, PA-C  11/03/2021 11:07 AM

## 2021-11-03 ENCOUNTER — Other Ambulatory Visit: Payer: Self-pay

## 2021-11-03 ENCOUNTER — Inpatient Hospital Stay (HOSPITAL_COMMUNITY): Payer: Medicare Other | Attending: Hematology

## 2021-11-03 ENCOUNTER — Inpatient Hospital Stay (HOSPITAL_BASED_OUTPATIENT_CLINIC_OR_DEPARTMENT_OTHER): Payer: Medicare Other | Admitting: Physician Assistant

## 2021-11-03 VITALS — BP 141/88 | HR 80 | Temp 98.7°F | Resp 18 | Ht 61.0 in | Wt 144.3 lb

## 2021-11-03 DIAGNOSIS — D751 Secondary polycythemia: Secondary | ICD-10-CM | POA: Insufficient documentation

## 2021-11-03 LAB — CBC WITH DIFFERENTIAL/PLATELET
Abs Immature Granulocytes: 0.02 10*3/uL (ref 0.00–0.07)
Basophils Absolute: 0 10*3/uL (ref 0.0–0.1)
Basophils Relative: 0 %
Eosinophils Absolute: 0.1 10*3/uL (ref 0.0–0.5)
Eosinophils Relative: 2 %
HCT: 48.5 % — ABNORMAL HIGH (ref 36.0–46.0)
Hemoglobin: 15.7 g/dL — ABNORMAL HIGH (ref 12.0–15.0)
Immature Granulocytes: 0 %
Lymphocytes Relative: 34 %
Lymphs Abs: 1.6 10*3/uL (ref 0.7–4.0)
MCH: 30.7 pg (ref 26.0–34.0)
MCHC: 32.4 g/dL (ref 30.0–36.0)
MCV: 94.9 fL (ref 80.0–100.0)
Monocytes Absolute: 0.3 10*3/uL (ref 0.1–1.0)
Monocytes Relative: 6 %
Neutro Abs: 2.7 10*3/uL (ref 1.7–7.7)
Neutrophils Relative %: 58 %
Platelets: 217 10*3/uL (ref 150–400)
RBC: 5.11 MIL/uL (ref 3.87–5.11)
RDW: 12.5 % (ref 11.5–15.5)
WBC: 4.7 10*3/uL (ref 4.0–10.5)
nRBC: 0 % (ref 0.0–0.2)

## 2021-11-03 NOTE — Patient Instructions (Signed)
Rachel [name] at [facility] Discharge Instructions  You were seen today by Tarri Abernethy PA-C for your erythrocytosis (elevated red blood cells).  Your red blood cells continue to remain elevated.  The testing for genetic mutations was negative, meaning that you do not have any genetic mutations causing your elevated red blood cells.  As we have previously discussed, your elevated blood cells may be due to some underlying sleep apnea.  Referral has been sent for sleep study.  Please follow up with your primary doctor as well.  LABS: Repeat labs in 6 months  OTHER TESTS: Sleep study (referral has been sent) - discuss this with your primary care doctor  MEDICATIONS: No changes to home medications  FOLLOW-UP APPOINTMENT: Office visit in 6 months, or sooner if needed based on your symptoms   Thank you for choosing [place] [facility] to provide your oncology and hematology care.  To afford each patient quality time with our provider, please arrive at least 15 minutes before your scheduled appointment time.   If you have a lab appointment with the [place] please come in thru the Main Entrance and check in at the main information desk.  You need to re-schedule your appointment should you arrive 10 or more minutes late.  We strive to give you quality time with our providers, and arriving late affects you and other patients whose appointments are after yours.  Also, if you no show three or more times for appointments you may be dismissed from the clinic at the providers discretion.     Again, thank you for choosing [facility].  Our hope is that these requests will decrease the amount of time that you wait before being seen by our physicians.       _____________________________________________________________  Should you have questions after your visit to [facility], please contact our office at [phone] and  follow the prompts.  Our office hours are 8:00 a.m. and 4:30 p.m. Monday - Friday.  Please note that voicemails left after 4:00 p.m. may not be returned until the following business day.  We are closed weekends and major holidays.  You do have access to a nurse 24-7, just call the main number to the clinic [phone] and do not press any options, hold on the line and a nurse will answer the phone.    For prescription refill requests, have your pharmacy contact our office and allow 72 hours.    Due to Covid, you will need to wear a mask upon entering the hospital. If you do not have a mask, a mask will be given to you at the Main Entrance upon arrival. For doctor visits, patients may have 1 support person age 67 or older with them. For treatment visits, patients can not have anyone with them due to social distancing guidelines and our immunocompromised population.

## 2021-12-02 ENCOUNTER — Ambulatory Visit
Admission: RE | Admit: 2021-12-02 | Discharge: 2021-12-02 | Disposition: A | Discharge status: Home / Self Care | Payer: Medicare Other | Source: Ambulatory Visit | Attending: Internal Medicine | Admitting: Internal Medicine

## 2021-12-02 ENCOUNTER — Other Ambulatory Visit: Payer: Self-pay

## 2021-12-02 DIAGNOSIS — M810 Age-related osteoporosis without current pathological fracture: Secondary | ICD-10-CM

## 2021-12-02 DIAGNOSIS — M8589 Other specified disorders of bone density and structure, multiple sites: Secondary | ICD-10-CM | POA: Diagnosis not present

## 2021-12-02 DIAGNOSIS — Z780 Asymptomatic menopausal state: Secondary | ICD-10-CM | POA: Diagnosis not present

## 2022-01-04 DIAGNOSIS — M5430 Sciatica, unspecified side: Secondary | ICD-10-CM | POA: Diagnosis not present

## 2022-01-04 DIAGNOSIS — R635 Abnormal weight gain: Secondary | ICD-10-CM | POA: Diagnosis not present

## 2022-01-04 DIAGNOSIS — E7801 Familial hypercholesterolemia: Secondary | ICD-10-CM | POA: Diagnosis not present

## 2022-01-04 DIAGNOSIS — I10 Essential (primary) hypertension: Secondary | ICD-10-CM | POA: Diagnosis not present

## 2022-03-04 DIAGNOSIS — E7800 Pure hypercholesterolemia, unspecified: Secondary | ICD-10-CM | POA: Diagnosis not present

## 2022-03-04 DIAGNOSIS — E7801 Familial hypercholesterolemia: Secondary | ICD-10-CM | POA: Diagnosis not present

## 2022-03-05 ENCOUNTER — Other Ambulatory Visit: Payer: Self-pay | Admitting: Internal Medicine

## 2022-03-05 DIAGNOSIS — Z1231 Encounter for screening mammogram for malignant neoplasm of breast: Secondary | ICD-10-CM

## 2022-03-08 DIAGNOSIS — I10 Essential (primary) hypertension: Secondary | ICD-10-CM | POA: Diagnosis not present

## 2022-03-08 DIAGNOSIS — M5430 Sciatica, unspecified side: Secondary | ICD-10-CM | POA: Diagnosis not present

## 2022-03-08 DIAGNOSIS — Z1331 Encounter for screening for depression: Secondary | ICD-10-CM | POA: Diagnosis not present

## 2022-03-08 DIAGNOSIS — Z1389 Encounter for screening for other disorder: Secondary | ICD-10-CM | POA: Diagnosis not present

## 2022-03-08 DIAGNOSIS — E7801 Familial hypercholesterolemia: Secondary | ICD-10-CM | POA: Diagnosis not present

## 2022-04-30 ENCOUNTER — Ambulatory Visit
Admission: RE | Admit: 2022-04-30 | Discharge: 2022-04-30 | Disposition: A | Discharge status: Home / Self Care | Payer: Medicare Other | Source: Ambulatory Visit | Attending: Internal Medicine | Admitting: Internal Medicine

## 2022-04-30 DIAGNOSIS — Z1231 Encounter for screening mammogram for malignant neoplasm of breast: Secondary | ICD-10-CM

## 2022-05-02 NOTE — Progress Notes (Unsigned)
[place], [place] [address]   CLINIC:  Medical Oncology/Hematology  PCP:  [place]u Nation, MD [place] [address] [phone]   REASON FOR VISIT:  Follow-up for erythrocytosis (suspected secondary polycythemia)  PRIOR THERAPY: None  CURRENT THERAPY: Observation  INTERVAL HISTORY:  Rachel [name] 71 y.o. female returns for routine follow-up of erythrocytosis, which is suspected to be secondary polycythemia in the setting of possible sleep apnea.  She was last seen by Tarri Abernethy PA-C on 11/03/2021.  At today's visit, she reports feeling well.    She denies any changes in her symptoms or health status since her last visit.  No erythromelalgia, aquagenic pruritus, vasomotor symptoms, strokelike symptoms, or B symptoms.  No signs or symptoms of blood clot since last visit.  She reports occasional hot flashes associated with night sweats.  She continues to report poor quality sleep.  She thinks that she snores at night.  She reports that she does not feel rested when she wakes in the morning.  She reports daytime drowsiness.  She has been referred for sleep study in the past, but never heard about scheduling an appointment.  She has 80% energy and 100% appetite. She endorses that she is maintaining a stable weight.   REVIEW OF SYSTEMS:    Review of Systems  Constitutional:  Positive for fatigue (energy 80%). Negative for appetite change, chills, diaphoresis, fever and unexpected weight change.  HENT:   Negative for lump/mass and nosebleeds.   Eyes:  Negative for eye problems.  Respiratory:  Negative for cough, hemoptysis and shortness of breath.   Cardiovascular:  Negative for chest pain, leg swelling and palpitations.  Gastrointestinal:  Negative for abdominal pain, blood in stool, constipation, diarrhea, nausea and vomiting.  Genitourinary:  Negative for hematuria.   Skin: Negative.   Neurological:  Negative for  dizziness, headaches and light-headedness.  Hematological:  Does not bruise/bleed easily.  Psychiatric/Behavioral:  Positive for sleep disturbance.       PAST MEDICAL/SURGICAL HISTORY:  No past medical history on file. Past Surgical History:  Procedure Laterality Date   ABDOMINAL HYSTERECTOMY     CHOLECYSTECTOMY N/A 01/17/2015   Procedure: LAPAROSCOPIC CHOLECYSTECTOMY;  Surgeon: Aviva Signs Md, MD;  Location: [facility];  Service: General;  Laterality: N/A;   MOHS SURGERY  07/2020   Dr. Winifred Olive   MOUTH SURGERY  03/2020   mass in left side at top- [place] HISTORY:  Social History   Socioeconomic History   Marital status: Widowed    Spouse name: Not on file   Number of children: Not on file   Years of education: Not on file   Highest education level: Not on file  Occupational History   Not on file  Tobacco Use   Smoking status: Never   Smokeless tobacco: Never  Vaping Use   Vaping Use: Never used  Substance and Sexual Activity   Alcohol use: No   Drug use: No   Sexual activity: Never    Birth control/protection: Surgical  Other Topics Concern   Not on file  Social History Narrative   Not on file   Social Determinants of Health   Financial Resource Strain: Low Risk  (05/28/2021)   Overall Financial Resource Strain (CARDIA)    Difficulty of Paying Living Expenses: Not hard at all  Food Insecurity: No Food Insecurity (05/28/2021)  Hunger Vital Sign    Worried About Running Out of Food in the Last Year: Never true    Ran Out of Food in the Last Year: Never true  Transportation Needs: No Transportation Needs (05/28/2021)   PRAPARE - Hydrologist (Medical): No    Lack of Transportation (Non-Medical): No  Physical Activity: Sufficiently Active (05/28/2021)   Exercise Vital Sign    Days of Exercise per Week: 3 days    Minutes of Exercise per Session: 60 min  Stress: No Stress Concern Present  (05/28/2021)   [place]    Feeling of Stress : Not at all  Social Connections: Moderately Integrated (05/28/2021)   Social Connection and Isolation Panel [NHANES]    Frequency of Communication with Friends and Family: More than three times a week    Frequency of Social Gatherings with Friends and Family: More than three times a week    Attends Religious Services: More than 4 times per year    Active Member of [employer] or Organizations: Yes    Attends Archivist Meetings: More than 4 times per year    Marital Status: Widowed  Intimate Partner Violence: Not At Risk (05/28/2021)   Humiliation, Afraid, Rape, and Kick questionnaire    Fear of Current or Ex-Partner: No    Emotionally Abused: No    Physically Abused: No    Sexually Abused: No    FAMILY HISTORY:  Family History  Problem Relation Age of Onset   Hypertension Mother    Tremor Mother    Thyroid disease Mother    Cancer Father    Liver cancer Father    Alzheimer's disease Father    Diabetes Paternal Aunt    Heart disease Paternal Aunt    Brain cancer Paternal Uncle    Parkinson's disease Neg Hx    Heart attack Neg Hx    Stroke Neg Hx     CURRENT MEDICATIONS:  Outpatient Encounter Medications as of 05/03/2022  Medication Sig   IBUPROFEN PO Take 1 tablet by mouth as needed.   Probiotic Product (PROBIOTIC DAILY PO) Take by mouth.   No facility-administered encounter medications on file as of 05/03/2022.    ALLERGIES:  Allergies  Allergen Reactions   Caffeine     "makes me very jittery"   E-Mycin [Erythromycin] Rash     PHYSICAL EXAM:    ECOG PERFORMANCE STATUS: 0 - Asymptomatic  There were no vitals filed for this visit. There were no vitals filed for this visit. Physical Exam Constitutional:      Appearance: Normal appearance.  HENT:     Head: Normocephalic and atraumatic.     Mouth/Throat:     Mouth: Mucous membranes are moist.   Eyes:     Extraocular Movements: Extraocular movements intact.     Pupils: Pupils are equal, round, and reactive to light.  Cardiovascular:     Rate and Rhythm: Normal rate and regular rhythm.     Pulses: Normal pulses.     Heart sounds: Normal heart sounds.  Pulmonary:     Effort: Pulmonary effort is normal.     Breath sounds: Normal breath sounds.  Abdominal:     General: Bowel sounds are normal.     Palpations: Abdomen is soft.     Tenderness: There is no abdominal tenderness.  Musculoskeletal:        General: No swelling.     Right lower leg:  No edema.     Left lower leg: No edema.  Lymphadenopathy:     Cervical: No cervical adenopathy.  Skin:    General: Skin is warm and dry.  Neurological:     General: No focal deficit present.     Mental Status: She is alert and oriented to person, place, and time.  Psychiatric:        Mood and Affect: Mood normal.        Behavior: Behavior normal.     LABORATORY DATA:  I have reviewed the labs as listed.  CBC    Component Value Date/Time   WBC 4.7 11/03/2021 0914   RBC 5.11 11/03/2021 0914   HGB 15.7 (H) 11/03/2021 0914   HCT 48.5 (H) 11/03/2021 0914   PLT 217 11/03/2021 0914   MCV 94.9 11/03/2021 0914   MCH 30.7 11/03/2021 0914   MCHC 32.4 11/03/2021 0914   RDW 12.5 11/03/2021 0914   LYMPHSABS 1.6 11/03/2021 0914   MONOABS 0.3 11/03/2021 0914   EOSABS 0.1 11/03/2021 0914   BASOSABS 0.0 11/03/2021 0914      Latest Ref Rng & Units 04/14/2021    8:56 AM 01/13/2015    9:00 AM  CMP  Glucose 65 - 99 mg/dL 92  100   BUN 7 - 25 mg/dL 15  20   Creatinine 0.60 - 1.00 mg/dL 0.68  0.69   Sodium 135 - 146 mmol/L 141  142   Potassium 3.5 - 5.3 mmol/L 4.8  4.9   Chloride 98 - 110 mmol/L 104  104   CO2 20 - 32 mmol/L 27  32   Calcium 8.6 - 10.4 mg/dL 9.4  9.5   Total Protein 6.1 - 8.1 g/dL 7.0  7.4   Total Bilirubin 0.2 - 1.2 mg/dL 0.6  0.7   Alkaline Phos 39 - 117 U/L  51   AST 10 - 35 U/L 16  21   ALT 6 - 29 U/L 13  17      DIAGNOSTIC IMAGING:  I have independently reviewed the relevant imaging and discussed with the patient.  ASSESSMENT & PLAN: 1.  Erythrocytosis - She has had persistently elevated hemoglobin/hematocrit since at least 2016, stable within baseline range of Hgb 15.0-16.0 and HCT 47.5-49.0 - Lifelong non-smoker.  No history of cardiac or pulmonary disease. - She may have some underlying sleep disorder as she reports that she does not feel rested when she wakes from sleep.  She admits to snoring and daytime drowsiness. - She lives in an older home with gas appliances - carbon monoxide and carboxyhemoglobin were normal - No history of blood clots    - She is currently asymptomatic and without erythromelalgia or vasomotor symptoms      - Hematology work-up (05/29/2021) showed normal erythropoietin 6.8, negative JAK2 V617F, CALR, MPL, and JAK2 E12-15 - Most recent CBC (05/03/2022): Hgb 15.0/HCT 46.8%.  Otherwise normal CBC. -Discussed with patient that indication for phlebotomy in case of secondary polycythemia would be if she began to have severe symptoms which include strokelike symptoms, severe recurrent headaches, severe fatigue or if HCT is greater than 54. - PLAN: Suspect that she may have erythrocytosis in the setting of possible undiagnosed OSA.  We will send new referral for sleep study/sleep medicine consult. - We will repeat labs and follow-up in office in 6 months, or sooner if indicated based on symptoms. - If she has any significant deviations from baseline or worsening of her blood counts, we would consider bone  marrow biopsy. - No indication for aspirin at this time, as she does not have any other cardiac risk factors such as hypertension, hyperlipidemia, or diabetes     PLAN SUMMARY & DISPOSITION: Continuing to await sleep study - will resend referral today Repeat labs and RTC in 6 months  All questions were answered. The patient knows to call the clinic with any problems,  questions or concerns.  Medical decision making: Low  Time spent on visit: I spent 15 minutes counseling the patient face to face. The total time spent in the appointment was 20 minutes and more than 50% was on counseling.   Harriett Rush, PA-C  05/03/2022 11:09 AM

## 2022-05-03 ENCOUNTER — Inpatient Hospital Stay (HOSPITAL_COMMUNITY): Payer: Medicare Other | Attending: Physician Assistant | Admitting: Physician Assistant

## 2022-05-03 ENCOUNTER — Inpatient Hospital Stay (HOSPITAL_COMMUNITY): Payer: Medicare Other

## 2022-05-03 VITALS — BP 147/84 | HR 82 | Temp 98.0°F | Resp 18 | Ht 61.0 in | Wt 149.7 lb

## 2022-05-03 DIAGNOSIS — D751 Secondary polycythemia: Secondary | ICD-10-CM

## 2022-05-03 DIAGNOSIS — R400 Somnolence: Secondary | ICD-10-CM | POA: Insufficient documentation

## 2022-05-03 DIAGNOSIS — R232 Flushing: Secondary | ICD-10-CM | POA: Diagnosis not present

## 2022-05-03 DIAGNOSIS — R61 Generalized hyperhidrosis: Secondary | ICD-10-CM | POA: Diagnosis not present

## 2022-05-03 LAB — CBC WITH DIFFERENTIAL/PLATELET
Abs Immature Granulocytes: 0.01 10*3/uL (ref 0.00–0.07)
Basophils Absolute: 0 10*3/uL (ref 0.0–0.1)
Basophils Relative: 0 %
Eosinophils Absolute: 0.1 10*3/uL (ref 0.0–0.5)
Eosinophils Relative: 1 %
HCT: 46.8 % — ABNORMAL HIGH (ref 36.0–46.0)
Hemoglobin: 15 g/dL (ref 12.0–15.0)
Immature Granulocytes: 0 %
Lymphocytes Relative: 34 %
Lymphs Abs: 1.5 10*3/uL (ref 0.7–4.0)
MCH: 29.9 pg (ref 26.0–34.0)
MCHC: 32.1 g/dL (ref 30.0–36.0)
MCV: 93.2 fL (ref 80.0–100.0)
Monocytes Absolute: 0.3 10*3/uL (ref 0.1–1.0)
Monocytes Relative: 7 %
Neutro Abs: 2.6 10*3/uL (ref 1.7–7.7)
Neutrophils Relative %: 58 %
Platelets: 200 10*3/uL (ref 150–400)
RBC: 5.02 MIL/uL (ref 3.87–5.11)
RDW: 12.7 % (ref 11.5–15.5)
WBC: 4.5 10*3/uL (ref 4.0–10.5)
nRBC: 0 % (ref 0.0–0.2)

## 2022-05-03 NOTE — Patient Instructions (Signed)
[place] [facility] Discharge Instructions  You were seen today by Tarri Abernethy PA-C for your erythrocytosis (elevated red blood cells).  Your red blood cells continue to remain very mildly elevated.  The testing for genetic mutations was negative, meaning that you do not have any genetic mutations causing your elevated red blood cells.  As we have previously discussed, your elevated blood cells may be due to some underlying sleep apnea.  Referral has been sent again for sleep study.  Please follow up with your primary doctor as well.  LABS: Repeat labs in 6 months  OTHER TESTS: Sleep study (referral has been sent to [facility]) - discuss this with your primary care doctor  MEDICATIONS: No changes to home medications  FOLLOW-UP APPOINTMENT: Office visit in 6 months, or sooner if needed based on your symptoms   - - - - - - - - - - - - - - - - - -    Thank you for choosing [place] at [facility] to provide your oncology and hematology care.  To afford each patient quality time with our provider, please arrive at least 15 minutes before your scheduled appointment time.   If you have a lab appointment with the [facility] please come in thru the Main Entrance and check in at the main information desk.  You need to re-schedule your appointment should you arrive 10 or more minutes late.  We strive to give you quality time with our providers, and arriving late affects you and other patients whose appointments are after yours.  Also, if you no show three or more times for appointments you may be dismissed from the clinic at the providers discretion.     Again, thank you for choosing [facility]  Our hope is that these requests will decrease the amount of time that you wait before being seen by our physicians.       _____________________________________________________________  Should you have questions  after your visit to [facility], please contact our office at [phone] and follow the prompts.  Our office hours are 8:00 a.m. and 4:30 p.m. Monday - Friday.  Please note that voicemails left after 4:00 p.m. may not be returned until the following business day.  We are closed weekends and major holidays.  You do have access to a nurse 24-7, just call the main number to the clinic [phone] and do not press any options, hold on the line and a nurse will answer the phone.    For prescription refill requests, have your pharmacy contact our office and allow 72 hours.    Due to Covid, you will need to wear a mask upon entering the hospital. If you do not have a mask, a mask will be given to you at the Main Entrance upon arrival. For doctor visits, patients may have 1 support person age 4 or older with them. For treatment visits, patients can not have anyone with them due to social distancing guidelines and our immunocompromised population.

## 2022-05-03 NOTE — Addendum Note (Signed)
Addended by: Tarri Abernethy on: 05/03/2022 11:14 AM   Modules accepted: Orders

## 2022-05-26 DIAGNOSIS — Z1331 Encounter for screening for depression: Secondary | ICD-10-CM | POA: Diagnosis not present

## 2022-05-26 DIAGNOSIS — E663 Overweight: Secondary | ICD-10-CM | POA: Diagnosis not present

## 2022-05-26 DIAGNOSIS — R7303 Prediabetes: Secondary | ICD-10-CM | POA: Diagnosis not present

## 2022-05-26 DIAGNOSIS — E8889 Other specified metabolic disorders: Secondary | ICD-10-CM | POA: Diagnosis not present

## 2022-05-26 DIAGNOSIS — E7800 Pure hypercholesterolemia, unspecified: Secondary | ICD-10-CM | POA: Diagnosis not present

## 2022-05-26 DIAGNOSIS — D751 Secondary polycythemia: Secondary | ICD-10-CM | POA: Diagnosis not present

## 2022-05-26 DIAGNOSIS — R5383 Other fatigue: Secondary | ICD-10-CM | POA: Diagnosis not present

## 2022-05-26 DIAGNOSIS — E538 Deficiency of other specified B group vitamins: Secondary | ICD-10-CM | POA: Diagnosis not present

## 2022-05-26 DIAGNOSIS — Z6827 Body mass index (BMI) 27.0-27.9, adult: Secondary | ICD-10-CM | POA: Diagnosis not present

## 2022-05-26 DIAGNOSIS — E559 Vitamin D deficiency, unspecified: Secondary | ICD-10-CM | POA: Diagnosis not present

## 2022-05-26 DIAGNOSIS — Z131 Encounter for screening for diabetes mellitus: Secondary | ICD-10-CM | POA: Diagnosis not present

## 2022-05-26 DIAGNOSIS — M8580 Other specified disorders of bone density and structure, unspecified site: Secondary | ICD-10-CM | POA: Diagnosis not present

## 2022-06-14 DIAGNOSIS — E663 Overweight: Secondary | ICD-10-CM | POA: Diagnosis not present

## 2022-06-14 DIAGNOSIS — M8580 Other specified disorders of bone density and structure, unspecified site: Secondary | ICD-10-CM | POA: Diagnosis not present

## 2022-06-14 DIAGNOSIS — Z72820 Sleep deprivation: Secondary | ICD-10-CM | POA: Diagnosis not present

## 2022-06-14 DIAGNOSIS — R7303 Prediabetes: Secondary | ICD-10-CM | POA: Diagnosis not present

## 2022-06-14 DIAGNOSIS — E7800 Pure hypercholesterolemia, unspecified: Secondary | ICD-10-CM | POA: Diagnosis not present

## 2022-06-14 DIAGNOSIS — E538 Deficiency of other specified B group vitamins: Secondary | ICD-10-CM | POA: Diagnosis not present

## 2022-06-17 ENCOUNTER — Encounter: Payer: Self-pay | Admitting: Neurology

## 2022-06-17 ENCOUNTER — Ambulatory Visit (INDEPENDENT_AMBULATORY_CARE_PROVIDER_SITE_OTHER): Payer: Medicare Other | Admitting: Neurology

## 2022-06-17 VITALS — BP 143/77 | HR 69 | Ht 61.0 in | Wt 148.0 lb

## 2022-06-17 DIAGNOSIS — D751 Secondary polycythemia: Secondary | ICD-10-CM

## 2022-06-17 DIAGNOSIS — R0683 Snoring: Secondary | ICD-10-CM | POA: Diagnosis not present

## 2022-06-17 DIAGNOSIS — R351 Nocturia: Secondary | ICD-10-CM | POA: Diagnosis not present

## 2022-06-17 DIAGNOSIS — E663 Overweight: Secondary | ICD-10-CM | POA: Diagnosis not present

## 2022-06-17 NOTE — Patient Instructions (Signed)

## 2022-06-17 NOTE — Progress Notes (Signed)
Subjective:    Patient ID: KIAHNA BANGHART is a 71 y.o. female.  HPI    Star Age, MD, PhD [facility] [address], [address] [place], [place] [address]  Dear Eugene Garnet,   I saw your patient, Dabney Dever, upon your kind request in my sleep clinic today for initial consultation of her sleep disorder, in particular, concern for underlying obstructive sleep apnea.  The patient is unaccompanied today.  As you know, Ms. Stallings is a 72 year old right-handed woman with an underlying medical history of erythrocytosis, suspected secondary polycythemia, and overweight state, who reports snoring and difficulty sleeping.  I reviewed your office note from 05/03/2022.  Her Epworth sleepiness score is 3 out of 24, fatigue severity score is 20 out of 63.  She has some history of snoring, is widowed for the past 8 years.  She reports that since her husband passed away she has had trouble going to sleep and staying asleep.  She does not feel depressed.  She has tried melatonin once which did not help.  She has on a rare occasion, on the whole night without sleeping.  She has a variable schedule but generally tries to be in bed between 9 and 10.  She has a TV on in her bedroom but tries to turn it off at 9 PM.  She lives with her daughter and her family which includes daughter's husband and 81 year old son, they live downstairs, patient lives upstairs.  She has another daughter and 1 son.  Patient is a non-smoker and does not drink alcohol and no daily caffeine, decaf coffee in the morning.  She had a tonsillectomy and adenoidectomy.  They have 2 dogs in the household downstairs.  She has a rise time around 6 AM but is often awake way before then.  She has nocturia about once per average night, denies nocturnal or morning headaches.    Her Past Medical History Is Significant For: Past Medical History:  Diagnosis Date   High cholesterol     Her Past Surgical History Is  Significant For: Past Surgical History:  Procedure Laterality Date   ABDOMINAL HYSTERECTOMY     CHOLECYSTECTOMY N/A 01/17/2015   Procedure: LAPAROSCOPIC CHOLECYSTECTOMY;  Surgeon: Aviva Signs Md, MD;  Location: [facility];  Service: General;  Laterality: N/A;   MOHS SURGERY  07/2020   Dr. Winifred Olive   MOUTH SURGERY  03/2020   mass in left side at top- [place]      Her Family History Is Significant For: Family History  Problem Relation Age of Onset   Hypertension Mother    Tremor Mother    Thyroid disease Mother    Cancer Father    Liver cancer Father    Alzheimer's disease Father    Diabetes Paternal Aunt    Heart disease Paternal Aunt    Brain cancer Paternal Uncle    Parkinson's disease Neg Hx    Heart attack Neg Hx    Stroke Neg Hx     Her Social History Is Significant For: Social History   Socioeconomic History   Marital status: Widowed    Spouse name: Not on file   Number of children: Not on file   Years of education: Not on file   Highest education level: Not on file  Occupational History   Not on file  Tobacco Use   Smoking status: Never   Smokeless tobacco: Never  Vaping Use  Vaping Use: Never used  Substance and Sexual Activity   Alcohol use: No   Drug use: No   Sexual activity: Never    Birth control/protection: Surgical  Other Topics Concern   Not on file  Social History Narrative   Caffeine NO.     Edcuation:  2 yr college   Retired: from Insurance account manager, Restaurant manager, fast food [employer]    Social Determinants of [place] Strain: [place]  (05/28/2021)   Overall Financial Resource Strain (CARDIA)    Difficulty of Paying Living Expenses: Not hard at all  Food Insecurity: No [place] (05/28/2021)   Hunger Vital Sign    Worried About Running Out of Food in the Last Year: Never true    [place] in the Last Year: Never true  Transportation Needs: No Transportation Needs (05/28/2021)   PRAPARE -  Hydrologist (Medical): No    Lack of Transportation (Non-Medical): No  Physical Activity: Sufficiently Active (05/28/2021)   Exercise Vital Sign    Days of Exercise per Week: 3 days    Minutes of Exercise per Session: 60 min  Stress: No Stress Concern Present (05/28/2021)   [place]    Feeling of Stress : Not at all  Social Connections: Moderately Integrated (05/28/2021)   Social Connection and Isolation Panel [NHANES]    Frequency of Communication with Friends and Family: More than three times a week    Frequency of Social Gatherings with Friends and Family: More than three times a week    Attends Religious Services: More than 4 times per year    Active Member of [employer] or Organizations: Yes    Attends Archivist Meetings: More than 4 times per year    Marital Status: Widowed    Her Allergies Are:  Allergies  Allergen Reactions   Caffeine     "makes me very jittery"   E-Mycin [Erythromycin] Rash  :   Her Current Medications Are:  Outpatient Encounter Medications as of 06/17/2022  Medication Sig   Calcium Citrate-Vitamin D (CALCITRATE/VITAMIN D PO) Take 2 tablets by mouth daily. 800/1000u   cyanocobalamin (VITAMIN B12) 1000 MCG tablet Take 1,000 mcg by mouth daily.   IBUPROFEN PO Take 1 tablet by mouth as needed.   Multiple Vitamins-Minerals (MULTIVITAMIN ADULTS PO) Take 2 tablets by mouth daily. MVI with omega 3 Natures Made   Probiotic Product (PROBIOTIC DAILY PO) Take by mouth.   rosuvastatin (CRESTOR) 5 MG tablet Take 5 mg by mouth daily.   No facility-administered encounter medications on file as of 06/17/2022.  :   Review of Systems:  Out of a complete 14 point review of systems, all are reviewed and negative with the exception of these symptoms as listed below:   Review of Systems  Neurological:        Fragmented sleep. Insomnia. Snoring. ESS  3, FSS 20. Tried  melatonin (not help).     Objective:  Neurological Exam  Physical Exam Physical Examination:   Vitals:   06/17/22 1024  BP: (!) 143/77  Pulse: 69    General Examination: The patient is a very pleasant 71 y.o. female in no acute distress. She appears well-developed and well-nourished and well groomed.   HEENT: Normocephalic, atraumatic, pupils are equal, round and reactive to light, extraocular tracking is good without limitation to gaze excursion or nystagmus noted. Hearing is grossly intact. Face is symmetric  with normal facial animation. Speech is clear with no dysarthria noted. There is no hypophonia. There is a mild side-to-side head tremor.  Very slight voice tremor at times.  Neck is supple with full range of passive and active motion. There are no carotid bruits on auscultation. Oropharynx exam reveals: mild mouth dryness, good dental hygiene and mild airway crowding secondary to small airway entry, tonsils absent, slightly redundant soft palate, normal tongue, Mallampati class II.  Neck circumference of 13-1/2 inches, no significant overbite.  Tongue protrudes centrally and palate elevates symmetrically.   Chest: Clear to auscultation without wheezing, rhonchi or crackles noted.  Heart: S1+S2+0, regular and normal without murmurs, rubs or gallops noted.   Abdomen: Soft, non-tender and non-distended.  Extremities: There is no pitting edema in the distal lower extremities bilaterally.   Skin: Warm and dry without trophic changes noted.   Musculoskeletal: exam reveals no obvious joint deformities.   Neurologically:  Mental status: The patient is awake, alert and oriented in all 4 spheres. Her immediate and remote memory, attention, language skills and fund of knowledge are appropriate. There is no evidence of aphasia, agnosia, apraxia or anomia. Speech is clear with normal prosody and enunciation. Thought process is linear. Mood is normal and affect is normal.  Cranial nerves II  - XII are as described above under HEENT exam.  Motor exam: Normal bulk, strength and tone is noted. There is a slight postural and action tremor in her hands.  Fine motor skills and coordination: grossly intact.  Cerebellar testing: No dysmetria or intention tremor. There is no truncal or gait ataxia.  Sensory exam: intact to light touch in the upper and lower extremities.  Gait, station and balance: She stands easily. No veering to one side is noted. No leaning to one side is noted. Posture is age-appropriate and stance is narrow based. Gait shows normal stride length and normal pace. No problems turning are noted.   Assessment and Plan:  In summary, ETHAN KASPERSKI is a very pleasant 71 y.o.-year old female with an underlying medical history of erythrocytosis, suspected secondary polycythemia, and overweight state, whose history and physical exam are concerning for sleep disordered breathing, supporting a current working diagnosis of unspecified sleep apnea, with the main differential diagnoses of obstructive sleep apnea (OSA) versus upper airway resistance syndrome (UARS) versus central sleep apnea (CSA), or mixed sleep apnea. A laboratory attended sleep study is considered gold standard for evaluation of sleep disordered breathing and is recommended at this time and clinically justified.  She also has evidence of essential tremor.  It is primarily manifested by a head tremor, very slight hand tremor, we talked about tremor triggers as well today.  I would not recommend any medications for her tremor control, however, the hope is that if she slept better her tremor would calm down some.  She has noticed that she has worsening tremor when she has not slept well or when she gets stressed. I had a long chat with the patient about my findings and the diagnosis of sleep apnea, particularly OSA, its prognosis and treatment options. We talked about medical/conservative treatments, surgical interventions and  non-pharmacological approaches for symptom control. I explained, in particular, the risks and ramifications of untreated moderate to severe OSA, especially with respect to developing cardiovascular disease down the road, including congestive heart failure (CHF), difficult to treat hypertension, cardiac arrhythmias (particularly A-fib), neurovascular complications including TIA, stroke and dementia. Even type 2 diabetes has, in part, been linked to untreated  OSA. Symptoms of untreated OSA may include (but may not be limited to) daytime sleepiness, nocturia (i.e. frequent nighttime urination), memory problems, mood irritability and suboptimally controlled or worsening mood disorder such as depression and/or anxiety, lack of energy, lack of motivation, physical discomfort, as well as recurrent headaches, especially morning or nocturnal headaches. We talked about the importance of maintaining a healthy lifestyle and striving for healthy weight. In addition, we talked about the importance of striving for and maintaining good sleep hygiene. I recommended the following at this time: sleep study.  I outlined the differences between a laboratory attended sleep study which is considered more comprehensive and accurate over the option of a home sleep test (HST); the latter may lead to underestimation of sleep disordered breathing in some instances and does not help with diagnosing upper airway resistance syndrome and is not accurate enough to diagnose primary central sleep apnea typically. I explained the different sleep test procedures to the patient in detail and also outlined possible surgical and non-surgical treatment options of OSA, including the use of a pressure airway pressure (PAP) device (ie CPAP, AutoPAP/APAP or BiPAP in certain circumstances), a custom-made dental device (aka oral appliance, which would require a referral to a specialist dentist or orthodontist typically, and is generally speaking not considered  a good choice for patients with full dentures or edentulous state), upper airway surgical options, such as traditional UPPP (which is not considered a first-line treatment) or the Inspire device (hypoglossal nerve stimulator, which would involve a referral for consultation with an ENT surgeon, after careful selection, following inclusion criteria). I explained the PAP treatment option to the patient in detail, as this is generally considered first-line treatment.  The patient indicated that she would be willing to try PAP therapy, if the need arises. I explained the importance of being compliant with PAP treatment, not only for insurance purposes but primarily to improve patient's symptoms symptoms, and for the patient's long term health benefit, including to reduce Her cardiovascular risks longer-term.    We will pick up our discussion about the next steps and treatment options after testing.  We will keep her posted as to the test results by phone call and/or MyChart messaging where possible.  We will plan to follow-up in sleep clinic accordingly as well.  I answered all her questions today and the patient was in agreement.   I encouraged her to call with any interim questions, concerns, problems or updates or email [place] through [place].  Generally speaking, sleep test authorizations may take up to 2 weeks, sometimes less, sometimes longer, the patient is encouraged to get in touch with [place] if they do not hear back from the sleep lab staff directly within the next 2 weeks.  Thank you very much for allowing me to participate in the care of this nice patient. If I can be of any further assistance to you please do not hesitate to call me at [phone].  Sincerely,   Star Age, MD, PhD

## 2022-06-29 DIAGNOSIS — E7800 Pure hypercholesterolemia, unspecified: Secondary | ICD-10-CM | POA: Diagnosis not present

## 2022-06-29 DIAGNOSIS — E663 Overweight: Secondary | ICD-10-CM | POA: Diagnosis not present

## 2022-06-29 DIAGNOSIS — E538 Deficiency of other specified B group vitamins: Secondary | ICD-10-CM | POA: Diagnosis not present

## 2022-06-29 DIAGNOSIS — R7303 Prediabetes: Secondary | ICD-10-CM | POA: Diagnosis not present

## 2022-06-29 DIAGNOSIS — Z72820 Sleep deprivation: Secondary | ICD-10-CM | POA: Diagnosis not present

## 2022-07-13 DIAGNOSIS — R030 Elevated blood-pressure reading, without diagnosis of hypertension: Secondary | ICD-10-CM | POA: Diagnosis not present

## 2022-07-13 DIAGNOSIS — Z72820 Sleep deprivation: Secondary | ICD-10-CM | POA: Diagnosis not present

## 2022-07-13 DIAGNOSIS — E663 Overweight: Secondary | ICD-10-CM | POA: Diagnosis not present

## 2022-07-13 DIAGNOSIS — E7800 Pure hypercholesterolemia, unspecified: Secondary | ICD-10-CM | POA: Diagnosis not present

## 2022-07-13 DIAGNOSIS — E538 Deficiency of other specified B group vitamins: Secondary | ICD-10-CM | POA: Diagnosis not present

## 2022-07-13 DIAGNOSIS — R7303 Prediabetes: Secondary | ICD-10-CM | POA: Diagnosis not present

## 2022-07-26 ENCOUNTER — Telehealth: Payer: Self-pay | Admitting: Neurology

## 2022-07-26 DIAGNOSIS — E663 Overweight: Secondary | ICD-10-CM | POA: Diagnosis not present

## 2022-07-26 DIAGNOSIS — R7303 Prediabetes: Secondary | ICD-10-CM | POA: Diagnosis not present

## 2022-07-26 DIAGNOSIS — R030 Elevated blood-pressure reading, without diagnosis of hypertension: Secondary | ICD-10-CM | POA: Diagnosis not present

## 2022-07-26 DIAGNOSIS — E7800 Pure hypercholesterolemia, unspecified: Secondary | ICD-10-CM | POA: Diagnosis not present

## 2022-07-26 DIAGNOSIS — E538 Deficiency of other specified B group vitamins: Secondary | ICD-10-CM | POA: Diagnosis not present

## 2022-07-26 NOTE — Telephone Encounter (Signed)
Medicare/BCBS supp no auth req   left VM to schedule 07/22/22 [place]

## 2022-07-28 NOTE — Telephone Encounter (Signed)
LVM for pt to call back to schedule.

## 2022-08-02 DIAGNOSIS — L814 Other melanin hyperpigmentation: Secondary | ICD-10-CM | POA: Diagnosis not present

## 2022-08-02 DIAGNOSIS — D225 Melanocytic nevi of trunk: Secondary | ICD-10-CM | POA: Diagnosis not present

## 2022-08-02 DIAGNOSIS — L570 Actinic keratosis: Secondary | ICD-10-CM | POA: Diagnosis not present

## 2022-08-02 DIAGNOSIS — Z08 Encounter for follow-up examination after completed treatment for malignant neoplasm: Secondary | ICD-10-CM | POA: Diagnosis not present

## 2022-08-02 DIAGNOSIS — L309 Dermatitis, unspecified: Secondary | ICD-10-CM | POA: Diagnosis not present

## 2022-08-02 DIAGNOSIS — L821 Other seborrheic keratosis: Secondary | ICD-10-CM | POA: Diagnosis not present

## 2022-08-02 DIAGNOSIS — Z85828 Personal history of other malignant neoplasm of skin: Secondary | ICD-10-CM | POA: Diagnosis not present

## 2022-08-19 DIAGNOSIS — Z6826 Body mass index (BMI) 26.0-26.9, adult: Secondary | ICD-10-CM | POA: Diagnosis not present

## 2022-08-19 DIAGNOSIS — R7303 Prediabetes: Secondary | ICD-10-CM | POA: Diagnosis not present

## 2022-08-19 DIAGNOSIS — E663 Overweight: Secondary | ICD-10-CM | POA: Diagnosis not present

## 2022-08-19 DIAGNOSIS — E538 Deficiency of other specified B group vitamins: Secondary | ICD-10-CM | POA: Diagnosis not present

## 2022-08-19 DIAGNOSIS — R030 Elevated blood-pressure reading, without diagnosis of hypertension: Secondary | ICD-10-CM | POA: Diagnosis not present

## 2022-08-19 DIAGNOSIS — E7800 Pure hypercholesterolemia, unspecified: Secondary | ICD-10-CM | POA: Diagnosis not present

## 2022-09-16 DIAGNOSIS — Z72820 Sleep deprivation: Secondary | ICD-10-CM | POA: Diagnosis not present

## 2022-09-16 DIAGNOSIS — E7800 Pure hypercholesterolemia, unspecified: Secondary | ICD-10-CM | POA: Diagnosis not present

## 2022-09-16 DIAGNOSIS — Z6826 Body mass index (BMI) 26.0-26.9, adult: Secondary | ICD-10-CM | POA: Diagnosis not present

## 2022-09-16 DIAGNOSIS — E663 Overweight: Secondary | ICD-10-CM | POA: Diagnosis not present

## 2022-09-16 DIAGNOSIS — R7303 Prediabetes: Secondary | ICD-10-CM | POA: Diagnosis not present

## 2022-09-16 DIAGNOSIS — R030 Elevated blood-pressure reading, without diagnosis of hypertension: Secondary | ICD-10-CM | POA: Diagnosis not present

## 2022-10-08 ENCOUNTER — Other Ambulatory Visit: Payer: Self-pay

## 2022-10-08 ENCOUNTER — Ambulatory Visit
Admission: RE | Admit: 2022-10-08 | Discharge: 2022-10-08 | Disposition: A | Discharge status: Home / Self Care | Payer: Medicare Other | Source: Ambulatory Visit | Attending: Nurse Practitioner | Admitting: Nurse Practitioner

## 2022-10-08 VITALS — BP 154/88 | HR 83 | Temp 99.4°F | Resp 20

## 2022-10-08 DIAGNOSIS — Z1152 Encounter for screening for COVID-19: Secondary | ICD-10-CM | POA: Insufficient documentation

## 2022-10-08 DIAGNOSIS — J069 Acute upper respiratory infection, unspecified: Secondary | ICD-10-CM | POA: Insufficient documentation

## 2022-10-08 MED ORDER — PROMETHAZINE-DM 6.25-15 MG/5ML PO SYRP: 5.0000 mL | ORAL_SOLUTION | Freq: Every evening | ORAL | 0 refills | Status: DC | PRN

## 2022-10-08 MED ORDER — FLUTICASONE PROPIONATE 50 MCG/ACT NA SUSP: 2.0000 | Freq: Every day | NASAL | 0 refills | Status: DC

## 2022-10-08 NOTE — ED Triage Notes (Signed)
Pt reports cough, nasal congestion since Tuesday. 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 Denies any gi/gu symptoms or fevers.

## 2022-10-08 NOTE — ED Provider Notes (Signed)
[facility]    CSN: [id number] Arrival date & time: 10/08/22  1243      History   Chief Complaint Chief Complaint  Patient presents with   Cough    Nose running and cough. No fever but I want to be sure it's a cold and not the beginning of something else. - Entered by patient    HPI Rachel [name] is a 72 y.o. female.   The history is provided by the patient.   The patient presents with a 3-day history of cough, nasal congestion, runny nose, and scratchy throat.  Patient denies fever, chills, headache, wheezing, shortness of breath, difficulty breathing, or GI symptoms.  Patient reports cough is worse at night, and has been affecting her sleep.  She has been taking over-the-counter Sudafed for her symptoms.  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  Past Medical History:  Diagnosis Date   High cholesterol     There are no problems to display for this patient.   Past Surgical History:  Procedure Laterality Date   ABDOMINAL HYSTERECTOMY     CHOLECYSTECTOMY N/A 01/17/2015   Procedure: LAPAROSCOPIC CHOLECYSTECTOMY;  Surgeon: Aviva Signs Md, MD;  Location: [facility];  Service: General;  Laterality: N/A;   MOHS SURGERY  07/2020   Dr. Winifred Olive   MOUTH SURGERY  03/2020   mass in left side at top- [place]      OB History   No obstetric history on file.      Home Medications    Prior to Admission medications   Medication Sig Start Date End Date Taking? Authorizing Provider  fluticasone (FLONASE) 50 MCG/ACT nasal spray Place 2 sprays into both nostrils daily. 10/08/22  Yes Talecia Sherlin-Warren, Alda Lea, NP  promethazine-dextromethorphan (PROMETHAZINE-DM) 6.25-15 MG/5ML syrup Take 5 mLs by mouth at bedtime as needed for cough. 10/08/22  Yes Angellynn Kimberlin-Warren, Alda Lea, NP  Calcium Citrate-Vitamin D (CALCITRATE/VITAMIN D PO) Take 2 tablets by mouth daily. 800/1000u    [provider]  cyanocobalamin  (VITAMIN B12) 1000 MCG tablet Take 1,000 mcg by mouth daily.    [provider]  IBUPROFEN PO Take 1 tablet by mouth as needed.    [provider]  Multiple Vitamins-Minerals (MULTIVITAMIN ADULTS PO) Take 2 tablets by mouth daily. MVI with omega 3 Natures Made    [provider]  Probiotic Product (PROBIOTIC DAILY PO) Take by mouth.    [provider]  rosuvastatin (CRESTOR) 5 MG tablet Take 5 mg by mouth daily.    [provider]    Family History Family History  Problem Relation Age of Onset   Hypertension Mother    Tremor Mother    Thyroid disease Mother    Cancer Father    Liver cancer Father    Alzheimer's disease Father    Diabetes Paternal Aunt    Heart disease Paternal Aunt    Brain cancer Paternal Uncle    Parkinson's disease Neg Hx    Heart attack Neg Hx    Stroke Neg Hx     Social History Social History   Tobacco Use   Smoking status: Never   Smokeless tobacco: Never  Vaping Use   Vaping Use: Never used  Substance Use Topics   Alcohol use: No   Drug use: No     Allergies   Caffeine and E-mycin [erythromycin]   Review of Systems Review of Systems Per  HPI  Physical Exam Triage Vital Signs ED Triage Vitals  Enc Vitals Group     BP 10/08/22 1305 (!) 154/88     Pulse Rate 10/08/22 1305 83     Resp 10/08/22 1305 20     Temp 10/08/22 1305 99.4 F (37.4 C)     Temp Source 10/08/22 1305 Oral     SpO2 10/08/22 1305 95 %     Weight --      Height --      Head Circumference --      Peak Flow --      Pain Score 10/08/22 1304 0     Pain Loc --      Pain Edu? --      Excl. in [place]? --    No data found.  Updated Vital Signs BP (!) 154/88 (BP Location: Right Arm)   Pulse 83   Temp 99.4 F (37.4 C) (Oral)   Resp 20   SpO2 95%   Visual Acuity Right Eye Distance:   Left Eye Distance:   Bilateral Distance:    Right Eye Near:   Left Eye Near:    Bilateral Near:     Physical Exam Vitals and nursing  note reviewed.  Constitutional:      General: She is not in acute distress.    Appearance: Normal appearance.  HENT:     Head: Normocephalic.     Right Ear: Tympanic membrane, ear canal and external ear normal.     Left Ear: Tympanic membrane, ear canal and external ear normal.     Nose: Congestion and rhinorrhea present.     Right Turbinates: Enlarged and swollen.     Left Turbinates: Enlarged and swollen.     Right Sinus: No maxillary sinus tenderness or frontal sinus tenderness.     Left Sinus: No maxillary sinus tenderness or frontal sinus tenderness.     Mouth/Throat:     Lips: Pink.     Mouth: Mucous membranes are moist.     Pharynx: Uvula midline. Posterior oropharyngeal erythema present. No pharyngeal swelling, oropharyngeal exudate or uvula swelling.  Eyes:     Extraocular Movements: Extraocular movements intact.     Conjunctiva/sclera: Conjunctivae normal.     Pupils: Pupils are equal, round, and reactive to light.  Cardiovascular:     Rate and Rhythm: Normal rate and regular rhythm.     Pulses: Normal pulses.     Heart sounds: Normal heart sounds.  Pulmonary:     Effort: Pulmonary effort is normal. No respiratory distress.     Breath sounds: Normal breath sounds. No stridor. No wheezing, rhonchi or rales.  Abdominal:     General: Bowel sounds are normal.     Palpations: Abdomen is soft.     Tenderness: There is no abdominal tenderness.  Musculoskeletal:     Cervical back: Normal range of motion.  Lymphadenopathy:     Cervical: No cervical adenopathy.  Skin:    General: Skin is warm and dry.  Neurological:     General: No focal deficit present.     Mental Status: She is alert and oriented to person, place, and time.  Psychiatric:        Mood and Affect: Mood normal.        Behavior: Behavior normal.      UC Treatments / Results  Labs (all labs ordered are listed, but only abnormal results are displayed) Labs Reviewed  SARS CORONAVIRUS 2 (TAT 6-24 HRS)  EKG   Radiology No results found.  Procedures Procedures (including critical care time)  Medications Ordered in UC Medications - No data to display  Initial Impression / Assessment and Plan / UC Course  I have reviewed the triage vital signs and the nursing notes.  Pertinent labs & imaging results that were available during my care of the patient were reviewed by me and considered in my medical decision making (see chart for details).  The patient is well-appearing, she is in no acute distress, she is mildly hypertensive, but vital signs are otherwise stable.  COVID test is pending.  Suspect a viral upper respiratory infection with cough pending the results of her COVID test.  Patient is a candidate to receive molnupiravir if her COVID test is positive.  In the interim, for her cough, Promethazine DM was prescribed, and for her nasal congestion, fluticasone 50 mcg nasal spray was prescribed.  Supportive care recommendations were provided to the patient to include increasing fluids, allowing for plenty of rest, and using a humidifier and sleeping elevated on pillows.  Discussed viral etiology with the patient and when follow-up may be necessary.  Patient verbalizes understanding.  All questions were answered.  Patient is stable for discharge.   Final Clinical Impressions(s) / UC Diagnoses   Final diagnoses:  Encounter for screening for COVID-19  Viral upper respiratory tract infection with cough     Discharge Instructions      COVID test is pending.  You will be contacted if the pending test results are positive.  As discussed, you are a candidate to receive molnupiravir if your COVID test is positive. Take medication as prescribed. Increase fluids and allow for plenty of rest. Recommend Tylenol or ibuprofen as needed for pain, fever, or general discomfort. Warm salt water gargles 3-4 times daily to help with throat pain or discomfort. Recommend using a humidifier at  bedtime during sleep to help with cough and nasal congestion. Sleep elevated on 2 pillows while cough symptoms persist. As discussed, a viral infection can last from 7 to 14 days.  If your symptoms suddenly worsen during that time, or extend beyond that timeframe, please follow-up in this clinic or with your primary care physician for further evaluation. Follow-up as needed.     ED Prescriptions     Medication Sig Dispense Auth. Provider   promethazine-dextromethorphan (PROMETHAZINE-DM) 6.25-15 MG/5ML syrup Take 5 mLs by mouth at bedtime as needed for cough. 100 mL Lakysha Kossman-Warren, Alda Lea, NP   fluticasone (FLONASE) 50 MCG/ACT nasal spray Place 2 sprays into both nostrils daily. 16 g Tenzin Edelman-Warren, Alda Lea, NP      PDMP not reviewed this encounter.   Tish Men, NP 10/08/22 1351

## 2022-10-08 NOTE — Discharge Instructions (Signed)
COVID test is pending.  You will be contacted if the pending test results are positive.  As discussed, you are a candidate to receive molnupiravir if your COVID test is positive. Take medication as prescribed. Increase fluids and allow for plenty of rest. Recommend Tylenol or ibuprofen as needed for pain, fever, or general discomfort. Warm salt water gargles 3-4 times daily to help with throat pain or discomfort. Recommend using a humidifier at bedtime during sleep to help with cough and nasal congestion. Sleep elevated on 2 pillows while cough symptoms persist. As discussed, a viral infection can last from 7 to 14 days.  If your symptoms suddenly worsen during that time, or extend beyond that timeframe, please follow-up in this clinic or with your primary care physician for further evaluation. Follow-up as needed.

## 2022-10-09 LAB — SARS CORONAVIRUS 2 (TAT 6-24 HRS): SARS Coronavirus 2: NEGATIVE

## 2022-11-05 ENCOUNTER — Ambulatory Visit: Payer: Medicare Other | Admitting: Physician Assistant

## 2022-11-05 ENCOUNTER — Inpatient Hospital Stay: Payer: Medicare Other

## 2022-12-01 ENCOUNTER — Inpatient Hospital Stay: Payer: Medicare Other

## 2022-12-02 ENCOUNTER — Inpatient Hospital Stay: Payer: Medicare Other | Attending: Hematology

## 2022-12-02 DIAGNOSIS — D751 Secondary polycythemia: Secondary | ICD-10-CM | POA: Diagnosis not present

## 2022-12-02 LAB — CBC WITH DIFFERENTIAL/PLATELET
Abs Immature Granulocytes: 0.01 10*3/uL (ref 0.00–0.07)
Basophils Absolute: 0 10*3/uL (ref 0.0–0.1)
Basophils Relative: 0 %
Eosinophils Absolute: 0 10*3/uL (ref 0.0–0.5)
Eosinophils Relative: 1 %
HCT: 49.2 % — ABNORMAL HIGH (ref 36.0–46.0)
Hemoglobin: 15.4 g/dL — ABNORMAL HIGH (ref 12.0–15.0)
Immature Granulocytes: 0 %
Lymphocytes Relative: 36 %
Lymphs Abs: 1.6 10*3/uL (ref 0.7–4.0)
MCH: 29.8 pg (ref 26.0–34.0)
MCHC: 31.3 g/dL (ref 30.0–36.0)
MCV: 95.2 fL (ref 80.0–100.0)
Monocytes Absolute: 0.3 10*3/uL (ref 0.1–1.0)
Monocytes Relative: 6 %
Neutro Abs: 2.5 10*3/uL (ref 1.7–7.7)
Neutrophils Relative %: 57 %
Platelets: 186 10*3/uL (ref 150–400)
RBC: 5.17 MIL/uL — ABNORMAL HIGH (ref 3.87–5.11)
RDW: 12.7 % (ref 11.5–15.5)
WBC: 4.5 10*3/uL (ref 4.0–10.5)
nRBC: 0 % (ref 0.0–0.2)

## 2022-12-07 NOTE — Progress Notes (Unsigned)
[place], [place] [address]   CLINIC:  Medical Oncology/Hematology  PCP:  [place], [place] [address] [phone]   REASON FOR VISIT:  Follow-up for erythrocytosis (suspected secondary polycythemia)   PRIOR THERAPY: None   CURRENT THERAPY: Observation  INTERVAL HISTORY:   Rachel [name] 72 y.o. female returns for routine follow-up of erythrocytosis, which is suspected to be secondary polycythemia in the setting of possible sleep apnea.  She was last seen by Tarri Abernethy PA-C on 05/03/2022.   At today's visit, she reports feeling well.***  She denies any changes in her symptoms or health status since her last visit. *** No erythromelalgia, aquagenic pruritus, vasomotor symptoms, strokelike symptoms, or B symptoms. *** No signs or symptoms of blood clot since last visit. *** She reports occasional hot flashes associated with night sweats.   She continues to report poor quality sleep.She thinks that she snores at night.  She reports that she does not feel rested when she wakes in the morning.  She reports daytime drowsiness.*** *** She had initial evaluation by neurology/sleep medicine, and was recommended for sleep study, but ***.   She has ***% energy and ***% appetite. She endorses that she is maintaining a stable weight.  ASSESSMENT & PLAN:  1.  Erythrocytosis - She has had persistently elevated hemoglobin/hematocrit since at least 2016, stable within baseline range of Hgb 15.0-16.0 and HCT 47.5-49.0 - Lifelong non-smoker.  No history of cardiac or pulmonary disease. - She may have some underlying sleep disorder as she reports that she does not feel rested when she wakes from sleep.  She admits to snoring and daytime drowsiness.*** - She lives in an older home with gas appliances - carbon monoxide and carboxyhemoglobin were normal - No history of blood clots    - She is currently asymptomatic and without  erythromelalgia or vasomotor symptoms      - Hematology work-up (05/29/2021) showed normal erythropoietin 6.8, negative JAK2 V617F, CALR, MPL, and JAK2 E12-15 - Most recent CBC (12/02/2022): Hgb 15.4/HCT 49.2 %.  Otherwise normal CBC.*** -Discussed with patient that indication for phlebotomy in case of secondary polycythemia would be if she began to have severe symptoms which include strokelike symptoms, severe recurrent headaches, severe fatigue or if HCT is greater than 54. - PLAN: Suspect that she may have erythrocytosis in the setting of possible undiagnosed OSA.  Recommend the patient reach out to [facility] again to schedule her sleep study.  *** - We will repeat labs and follow-up in office in 6 months, or sooner if indicated based on symptoms.*** - If she has any significant deviations from baseline or worsening of her blood counts, we would consider bone marrow biopsy. - No indication for aspirin at this time, as she does not have any other cardiac risk factors such as hypertension, hyperlipidemia, or diabetes     PLAN SUMMARY: >> *** >> *** >> ***    REVIEW OF SYSTEMS: ***  Review of Systems - Oncology   PHYSICAL EXAM:  ECOG PERFORMANCE STATUS: {CHL ONC ECOG [id number] *** There were no vitals filed for this visit. There were no vitals filed for this visit. Physical Exam  PAST MEDICAL/SURGICAL HISTORY:  Past Medical History:  Diagnosis Date   High cholesterol    Past Surgical History:  Procedure Laterality Date   ABDOMINAL HYSTERECTOMY     CHOLECYSTECTOMY N/A 01/17/2015   Procedure: LAPAROSCOPIC CHOLECYSTECTOMY;  Surgeon: Aviva Signs Md, MD;  Location: [facility];  Service: General;  Laterality: N/A;   MOHS SURGERY  07/2020   Dr. Winifred Olive   MOUTH SURGERY  03/2020   mass in left side at top- [place] HISTORY:  Social History   Socioeconomic History   Marital status: Widowed    Spouse name: Not on file    Number of children: Not on file   Years of education: Not on file   Highest education level: Not on file  Occupational History   Not on file  Tobacco Use   Smoking status: Never   Smokeless tobacco: Never  Vaping Use   Vaping Use: Never used  Substance and Sexual Activity   Alcohol use: No   Drug use: No   Sexual activity: Never    Birth control/protection: Surgical  Other Topics Concern   Not on file  Social History Narrative   Caffeine NO.     Edcuation:  2 yr college   Retired: from Insurance account manager, Restaurant manager, fast food [employer]    Social Determinants of [place] Strain: [place]  (05/28/2021)   Overall Financial Resource Strain (CARDIA)    Difficulty of Paying Living Expenses: Not hard at all  Food Insecurity: No [place] (05/28/2021)   Hunger Vital Sign    Worried About Running Out of Food in the Last Year: Never true    [place] in the Last Year: Never true  Transportation Needs: No Transportation Needs (05/28/2021)   PRAPARE - Hydrologist (Medical): No    Lack of Transportation (Non-Medical): No  Physical Activity: Sufficiently Active (05/28/2021)   Exercise Vital Sign    Days of Exercise per Week: 3 days    Minutes of Exercise per Session: 60 min  Stress: No Stress Concern Present (05/28/2021)   [place]    Feeling of Stress : Not at all  Social Connections: Moderately Integrated (05/28/2021)   Social Connection and Isolation Panel [NHANES]    Frequency of Communication with Friends and Family: More than three times a week    Frequency of Social Gatherings with Friends and Family: More than three times a week    Attends Religious Services: More than 4 times per year    Active Member of [employer] or Organizations: Yes    Attends Archivist Meetings: More than 4 times per year    Marital Status: Widowed  Intimate Partner Violence: Not At Risk  (05/28/2021)   Humiliation, Afraid, Rape, and Kick questionnaire    Fear of Current or Ex-Partner: No    Emotionally Abused: No    Physically Abused: No    Sexually Abused: No    FAMILY HISTORY:  Family History  Problem Relation Age of Onset   Hypertension Mother    Tremor Mother    Thyroid disease Mother    Cancer Father    Liver cancer Father    Alzheimer's disease Father    Diabetes Paternal Aunt    Heart disease Paternal Aunt    Brain cancer Paternal Uncle    Parkinson's disease Neg Hx    Heart attack Neg Hx    Stroke Neg Hx     CURRENT MEDICATIONS:  Outpatient Encounter Medications as of 12/08/2022  Medication Sig Note   Calcium Citrate-Vitamin D (CALCITRATE/VITAMIN D PO) Take 2 tablets by mouth daily. 800/1000u  cyanocobalamin (VITAMIN B12) 1000 MCG tablet Take 1,000 mcg by mouth daily.    fluticasone (FLONASE) 50 MCG/ACT nasal spray Place 2 sprays into both nostrils daily.    IBUPROFEN PO Take 1 tablet by mouth as needed. 06/17/2022: '200mg'$     Multiple Vitamins-Minerals (MULTIVITAMIN ADULTS PO) Take 2 tablets by mouth daily. MVI with omega 3 Natures Made    Probiotic Product (PROBIOTIC DAILY PO) Take by mouth.    promethazine-dextromethorphan (PROMETHAZINE-DM) 6.25-15 MG/5ML syrup Take 5 mLs by mouth at bedtime as needed for cough.    rosuvastatin (CRESTOR) 5 MG tablet Take 5 mg by mouth daily.    No facility-administered encounter medications on file as of 12/08/2022.    ALLERGIES:  Allergies  Allergen Reactions   Caffeine     "makes me very jittery"   E-Mycin [Erythromycin] Rash    LABORATORY DATA:  I have reviewed the labs as listed.  CBC    Component Value Date/Time   WBC 4.5 12/02/2022 0923   RBC 5.17 (H) 12/02/2022 0923   HGB 15.4 (H) 12/02/2022 0923   HCT 49.2 (H) 12/02/2022 0923   PLT 186 12/02/2022 0923   MCV 95.2 12/02/2022 0923   MCH 29.8 12/02/2022 0923   MCHC 31.3 12/02/2022 0923   RDW 12.7 12/02/2022 0923   LYMPHSABS 1.6 12/02/2022 0923    MONOABS 0.3 12/02/2022 0923   EOSABS 0.0 12/02/2022 0923   BASOSABS 0.0 12/02/2022 0923      Latest Ref Rng & Units 04/14/2021    8:56 AM 01/13/2015    9:00 AM  CMP  Glucose 65 - 99 mg/dL 92  100   BUN 7 - 25 mg/dL 15  20   Creatinine 0.60 - 1.00 mg/dL 0.68  0.69   Sodium 135 - 146 mmol/L 141  142   Potassium 3.5 - 5.3 mmol/L 4.8  4.9   Chloride 98 - 110 mmol/L 104  104   CO2 20 - 32 mmol/L 27  32   Calcium 8.6 - 10.4 mg/dL 9.4  9.5   Total Protein 6.1 - 8.1 g/dL 7.0  7.4   Total Bilirubin 0.2 - 1.2 mg/dL 0.6  0.7   Alkaline Phos 39 - 117 U/L  51   AST 10 - 35 U/L 16  21   ALT 6 - 29 U/L 13  17     DIAGNOSTIC IMAGING:  I have independently reviewed the relevant imaging and discussed with the patient.   WRAP UP:  All questions were answered. The patient knows to call the clinic with any problems, questions or concerns.  Medical decision making: ***  Time spent on visit: I spent *** minutes counseling the patient face to face. The total time spent in the appointment was *** minutes and more than 50% was on counseling.  Harriett Rush, PA-C  ***

## 2022-12-08 ENCOUNTER — Inpatient Hospital Stay: Payer: Medicare Other | Attending: Hematology | Admitting: Physician Assistant

## 2022-12-08 VITALS — BP 136/81 | HR 82 | Temp 98.2°F | Resp 16 | Wt 145.9 lb

## 2022-12-08 DIAGNOSIS — Z79899 Other long term (current) drug therapy: Secondary | ICD-10-CM | POA: Insufficient documentation

## 2022-12-08 DIAGNOSIS — R400 Somnolence: Secondary | ICD-10-CM | POA: Insufficient documentation

## 2022-12-08 DIAGNOSIS — R61 Generalized hyperhidrosis: Secondary | ICD-10-CM | POA: Insufficient documentation

## 2022-12-08 DIAGNOSIS — R0683 Snoring: Secondary | ICD-10-CM | POA: Diagnosis not present

## 2022-12-08 DIAGNOSIS — D751 Secondary polycythemia: Secondary | ICD-10-CM | POA: Diagnosis not present

## 2022-12-08 DIAGNOSIS — R251 Tremor, unspecified: Secondary | ICD-10-CM | POA: Diagnosis not present

## 2022-12-08 DIAGNOSIS — R232 Flushing: Secondary | ICD-10-CM | POA: Diagnosis not present

## 2022-12-08 NOTE — Patient Instructions (Addendum)
[place] at [facility] Discharge Instructions  You were seen today by Tarri Abernethy PA-C for your erythrocytosis (elevated red blood cells).  Your red blood cells continue to remain very mildly elevated.   As we have previously discussed, your elevated blood cells may be due to some underlying sleep apnea.  Please call [facility] at [phone] to schedule your sleep study.    A new referral has also been sent to [facility] for your tremors as well. Please follow up with your primary doctor as well.  LABS: Repeat labs in 6 months  FOLLOW-UP APPOINTMENT: Office visit in 6 months, or sooner if needed based on your symptoms   - - - - - - - - - - - - - - - - - -    Thank you for choosing [place] [facility] to provide your oncology and hematology care.  To afford each patient quality time with our provider, please arrive at least 15 minutes before your scheduled appointment time.   If you have a lab appointment with the [facility] please come in thru the Main Entrance and check in at the main information desk.  You need to re-schedule your appointment should you arrive 10 or more minutes late.  We strive to give you quality time with our providers, and arriving late affects you and other patients whose appointments are after yours.  Also, if you no show three or more times for appointments you may be dismissed from the clinic at the providers discretion.     Again, thank you for choosing [facility].  Our hope is that these requests will decrease the amount of time that you wait before being seen by our physicians.       _____________________________________________________________  Should you have questions after your visit to [facility], please contact our office at [phone] and follow the prompts.  Our office hours are 8:00 a.m. and 4:30 p.m. Monday -  Friday.  Please note that voicemails left after 4:00 p.m. may not be returned until the following business day.  We are closed weekends and major holidays.  You do have access to a nurse 24-7, just call the main number to the clinic [phone] and do not press any options, hold on the line and a nurse will answer the phone.    For prescription refill requests, have your pharmacy contact our office and allow 72 hours.    Due to Covid, you will need to wear a mask upon entering the hospital. If you do not have a mask, a mask will be given to you at the Main Entrance upon arrival. For doctor visits, patients may have 1 support person age 78 or older with them. For treatment visits, patients can not have anyone with them due to social distancing guidelines and our immunocompromised population.

## 2023-01-10 ENCOUNTER — Other Ambulatory Visit: Payer: Self-pay | Admitting: Internal Medicine

## 2023-01-10 DIAGNOSIS — Z1231 Encounter for screening mammogram for malignant neoplasm of breast: Secondary | ICD-10-CM

## 2023-02-07 ENCOUNTER — Ambulatory Visit (INDEPENDENT_AMBULATORY_CARE_PROVIDER_SITE_OTHER): Payer: Medicare Other | Admitting: Neurology

## 2023-02-07 ENCOUNTER — Encounter: Payer: Self-pay | Admitting: Neurology

## 2023-02-07 VITALS — BP 133/80 | HR 70 | Ht 61.0 in | Wt 147.0 lb

## 2023-02-07 DIAGNOSIS — F439 Reaction to severe stress, unspecified: Secondary | ICD-10-CM | POA: Diagnosis not present

## 2023-02-07 DIAGNOSIS — R0683 Snoring: Secondary | ICD-10-CM | POA: Diagnosis not present

## 2023-02-07 DIAGNOSIS — E663 Overweight: Secondary | ICD-10-CM | POA: Diagnosis not present

## 2023-02-07 DIAGNOSIS — D751 Secondary polycythemia: Secondary | ICD-10-CM | POA: Diagnosis not present

## 2023-02-07 DIAGNOSIS — R351 Nocturia: Secondary | ICD-10-CM

## 2023-02-07 DIAGNOSIS — G250 Essential tremor: Secondary | ICD-10-CM

## 2023-02-07 NOTE — Patient Instructions (Addendum)
You have a mild tremor of both hands, and a more prominent tremor in you head and lower jaw, you likely have what we call essential tremor. It is often hereditary.    I do not see any signs or symptoms of parkinson's like disease or what we call parkinsonism.  For your tremor, I would not recommend any new medications at this time. We may consider a low dose beta blocker in the future.   We will pursue your sleep study.   We may also consider a different medication for tremor, called primidone, in the future.   Please remember, that any kind of tremor may be exacerbated by anxiety, anger, nervousness, excitement, dehydration, sleep deprivation, thyroid dysfunction, by caffeine, and low blood sugar values or blood sugar fluctuations. Some medications can exacerbate tremors, this includes certain asthma or COPD medications and certain antidepressants.   Please have your primary care check your thyroid function at the next physical and scheduled blood work appointment.   We will plan a follow-up after your sleep study.  If you have obstructive sleep apnea, I will likely offer you an AutoPap machine.

## 2023-02-07 NOTE — Progress Notes (Signed)
Subjective:    [name] ID: Rachel [name] is a 72 y.o. female.  HPI    Interim history:   Rachel [name] is a 72 year old right-handed woman with an underlying medical history of erythrocytosis, suspected secondary polycythemia, and overweight state, who presents for a new problem visit of tremors.  She is referred by Rachel Brenner, PA.  I had evaluated her in September 2023 for concern for sleep apnea.  She did not pursue sleep testing at Rachel time.  Today, 02/07/2023: She reports a longstanding Hx of head tremors, approx. 10 years.  Tremor has progressed over time and in Rachel past few years she has had hand tremors as well, particularly bothersome when she holds something or writes something with her right hand.  She noticed a significant worsening of her head tremor in church recently and was trying to stabilize her head by holding onto her chin.  She has not fallen recently thankfully.  She does endorse stress, she takes care of her 42 year old mom.  [name] herself stays with her daughter, both daughters have tremors, 1 daughter has tremors in her hands and Rachel other has a tremor in her trunk or whole body as [name] recalls.  Sometimes [name]'s teeth chattering of her jaw is almost closed.  She does not drink any coffee or soda but drinks decaf coffee, 2 to 3 cups/day.  She tries to hydrate well with water.  Her mom does not have a tremor.  She has not tried any medications for her tremor.  She does not like to take medications.  She had a physical last year but does not recall her last thyroid function test.   I reviewed Rachel office visit note with hematology on 12/08/2022.  She has had elevated hemoglobin and hematocrit values, concern for secondary polycythemia.  I do not see a recent TSH or thyroid function test.  She is does not sleep well.  She could not pursue her sleep study because of her mom's health and taking care of her mom.  She would like to schedule a sleep study at this time as she  continues to have sleep disruption, chronic difficulty with sleep consolidation and poor sleep quality.  Rachel [name]'s allergies, current medications, family history, past medical history, past social history, past surgical history and problem list were reviewed and updated as appropriate.   Previously:   06/17/22: (She) reports snoring and difficulty sleeping.  I reviewed your office note from 05/03/2022.  Her Epworth sleepiness score is 3 out of 24, fatigue severity score is 20 out of 63.  She has some history of snoring, is widowed for Rachel past 8 years.  She reports that since her husband passed away she has had trouble going to sleep and staying asleep.  She does not feel depressed.  She has tried melatonin once which did not help.  She has on a rare occasion, on Rachel whole night without sleeping.  She has a variable schedule but generally tries to be in bed between 9 and 10.  She has a TV on in her bedroom but tries to turn it off at 9 PM.  She lives with her daughter and her family which includes daughter's husband and 25 year old son, they live downstairs, [name] lives upstairs.  She has another daughter and 1 son.  [name] is a non-smoker and does not drink alcohol and no daily caffeine, decaf coffee in Rachel morning.  She had a tonsillectomy and adenoidectomy.  They have 2 dogs in Rachel  household downstairs.  She has a rise time around 6 AM but is often awake way before then.  She has nocturia about once per average night, denies nocturnal or morning headaches.     Her Past Medical History Is Significant For: Past Medical History:  Diagnosis Date   High cholesterol     Her Past Surgical History Is Significant For: Past Surgical History:  Procedure Laterality Date   ABDOMINAL HYSTERECTOMY     CHOLECYSTECTOMY N/A 01/17/2015   Procedure: LAPAROSCOPIC CHOLECYSTECTOMY;  Surgeon: Franky Macho Md, MD;  Location: [facility];  Service: General;  Laterality: N/A;   MOHS SURGERY  07/2020   Dr. Jeannine Boga    MOUTH SURGERY  03/2020   mass in left side at top- Dr.Owsley   TONSILLECTOMY     TUBAL LIGATION      Her Family History Is Significant For: Family History  Problem Relation Age of Onset   Hypertension Mother    Tremor Mother    Thyroid disease Mother    Cancer Father    Liver cancer Father    Alzheimer's disease Father    Diabetes Paternal Aunt    Heart disease Paternal Aunt    Brain cancer Paternal Uncle    Parkinson's disease Neg Hx    Heart attack Neg Hx    Stroke Neg Hx     Her Social History Is Significant For: Social History   Socioeconomic History   Marital status: Widowed    Spouse name: Not on file   Number of children: Not on file   Years of education: Not on file   Highest education level: Not on file  Occupational History   Not on file  Tobacco Use   Smoking status: Never   Smokeless tobacco: Never  Vaping Use   Vaping Use: Never used  Substance and Sexual Activity   Alcohol use: No   Drug use: No   Sexual activity: Never    Birth control/protection: Surgical  Other Topics Concern   Not on file  Social History Narrative   Caffeine NO.     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    Social Determinants of Health   Financial Resource Strain: Low Risk  (05/28/2021)   Overall Financial Resource Strain (CARDIA)    Difficulty of Paying Living Expenses: Not hard at all  Food Insecurity: No Food Insecurity (05/28/2021)   Hunger Vital Sign    Worried About Running Out of Food in Rachel Last Year: Never true    Ran Out of Food in Rachel Last Year: Never true  Transportation Needs: No Transportation Needs (05/28/2021)   PRAPARE - Administrator, Civil Service (Medical): No    Lack of Transportation (Non-Medical): No  Physical Activity: Sufficiently Active (05/28/2021)   Exercise Vital Sign    Days of Exercise per Week: 3 days    Minutes of Exercise per Session: 60 min  Stress: No Stress Concern Present  (05/28/2021)   [employer] of Occupational Health - Occupational Stress Questionnaire    Feeling of Stress : Not at all  Social Connections: Moderately Integrated (05/28/2021)   Social Connection and Isolation Panel [NHANES]    Frequency of Communication with Friends and Family: More than three times a week    Frequency of Social Gatherings with Friends and Family: More than three times a week    Attends Religious Services: More than 4 times per year    Active  Member of Clubs or Organizations: Yes    Attends Engineer, structural: More than 4 times per year    Marital Status: Widowed    Her Allergies Are:  Allergies  Allergen Reactions   Caffeine     "makes me very jittery"   E-Mycin [Erythromycin] Rash  :   Her Current Medications Are:  Outpatient Encounter Medications as of 02/07/2023  Medication Sig   Calcium Citrate-Vitamin D (CALCITRATE/VITAMIN D PO) Take 2 tablets by mouth daily. 800/1000u   cyanocobalamin (VITAMIN B12) 1000 MCG tablet Take 1,000 mcg by mouth daily.   IBUPROFEN PO Take 1 tablet by mouth as needed.   Multiple Vitamins-Minerals (MULTIVITAMIN ADULTS PO) Take 2 tablets by mouth daily. MVI with omega 3 Natures Made   Probiotic Product (PROBIOTIC DAILY PO) Take by mouth.   No facility-administered encounter medications on file as of 02/07/2023.  :  Review of Systems:  Out of a complete 14 point review of systems, all are reviewed and negative with Rachel exception of these symptoms as listed below:  Review of Systems  Neurological:        Pt here for tremors Pt states head ,facial ,and head tremors. Pt states tremors have increased since last visit     Objective:  Neurological Exam  Physical Exam Physical Examination:   Vitals:   02/07/23 0752  BP: 133/80  Pulse: 70    General Examination: Rachel [name] is a very pleasant 72 y.o. female in no acute distress. She appears well-developed and well-nourished and well groomed.   HEENT:  Normocephalic, atraumatic, pupils are equal, round and reactive to light, extraocular tracking is good without limitation to gaze excursion or nystagmus noted. Hearing is grossly intact. Face is symmetric with normal facial animation. Speech is clear with no dysarthria noted. There is no hypophonia. There is a mild side-to-side head tremor, fluctuating in intensity.  Slight voice tremor at times, intermittent lower jaw tremor. Neck is supple with full range of passive and active motion. There are no carotid bruits on auscultation. Oropharynx exam reveals: Stable findings.  No abnormal tongue movements.     Chest: Clear to auscultation without wheezing, rhonchi or crackles noted.   Heart: S1+S2+0, regular and normal without murmurs, rubs or gallops noted.    Abdomen: Soft, non-tender and non-distended.   Extremities: There is no pitting edema in Rachel distal lower extremities bilaterally.    Skin: Warm and dry without trophic changes noted.    Musculoskeletal: exam reveals no obvious joint deformities, mild arthritic changes in both hands.    Neurologically:  Mental status: Rachel [name] is awake, alert and oriented in all 4 spheres. Her immediate and remote memory, attention, language skills and fund of knowledge are appropriate. There is no evidence of aphasia, agnosia, apraxia or anomia. Speech is clear with normal prosody and enunciation. Thought process is linear. Mood is normal and affect is normal.  Cranial nerves II - XII are as described above under HEENT exam.  Motor exam: Normal bulk, strength and tone is noted. There is a slight postural and action tremor in her hands.  Fine motor skills and coordination: grossly intact with finger taps, hand movements. Foot taps are grossly intact as well.   On 02/07/2023: On Archimedes spiral drawing she has mild trembling with Rachel right and mild to moderate trembling with Rachel left hand.  Handwriting with Rachel right hand is tremulous, legible, not  particularly micrographic. Romberg is negative, tandem walk is good. Cerebellar testing: No dysmetria  or intention tremor. There is no truncal or gait ataxia.  Sensory exam: intact to light touch in Rachel upper and lower extremities.  Gait, station and balance: She stands easily. No veering to one side is noted. No leaning to one side is noted. Posture is age-appropriate and stance is narrow based. Gait shows normal stride length and normal pace. No problems turning are noted.    Assessment and Plan:  In summary, Rachel [name] is a very pleasant 72 year old female with an underlying medical history of erythrocytosis, suspected secondary polycythemia, and overweight state, who presents for evaluation of her tremor condition.  She has had a longstanding history of tremors affecting her head and jaw area.  In Rachel past few years she has had a hand tremor, tremor has become worse over time, biggest trigger seems to be stress but also poor sleep consolidation.  We talked about tremor conditions at length.  She is advised that she likely has essential tremor.  Both daughters have some degree of tremor as well.  She is reassured that there is no evidence of parkinsonism.  She is encouraged to pursue sleep testing for her sleep disturbance.  In particular, if she does have sleep apnea, treating her sleep apnea may improve her stress level but also her tremor in turn.  I do not see a pressing reason for a brain scan, she has no other focal signs and no other alarming symptoms.  We talked about tremor triggers and alleviating factors.  She is encouraged to stay well-hydrated and try to get enough rest, we talked about Rachel importance of stress reduction.  She is advised to continue to limit her caffeine.  She is encouraged to get her thyroid function test done at Rachel next physical.  She has no history of thyroid disease.  She is advised about certain symptomatic medications we use for tremor reduction, in particular, we  talked about utilizing a beta-blocker in low-dose in Rachel near future vs primidone/Mysoline.  We talked about expectations and limitations in possible common side effects with these 2 medications.  We mutually agreed to not start any new medications quite yet.  She would like to pursue her sleep study first, we will plan a follow-up after sleep testing.  She would be agreeable to using a CPAP if she has sleep apnea.  I answered all her questions today and she was in agreement with our plan.   I spent 40 minutes in total face-to-face time and in reviewing records during pre-charting, more than 50% of which was spent in counseling and coordination of care, reviewing test results, reviewing medications and treatment regimen and/or in discussing or reviewing Rachel diagnosis of ET, sleep disturbance, Rachel prognosis and treatment options. Pertinent laboratory and imaging test results that were available during this visit with Rachel [name] were reviewed by me and considered in my medical decision making (see chart for details).

## 2023-02-24 ENCOUNTER — Telehealth: Payer: Self-pay | Admitting: Neurology

## 2023-02-24 NOTE — Telephone Encounter (Signed)
NPSG - Medicare/BCBS supp no auth req'd   Patient is scheduled at [facility] for 05/16/23 at 8 pm.  Mailed packet to the patient.

## 2023-03-28 DIAGNOSIS — M9902 Segmental and somatic dysfunction of thoracic region: Secondary | ICD-10-CM | POA: Diagnosis not present

## 2023-03-28 DIAGNOSIS — M9901 Segmental and somatic dysfunction of cervical region: Secondary | ICD-10-CM | POA: Diagnosis not present

## 2023-03-28 DIAGNOSIS — M5033 Other cervical disc degeneration, cervicothoracic region: Secondary | ICD-10-CM | POA: Diagnosis not present

## 2023-03-28 DIAGNOSIS — M5134 Other intervertebral disc degeneration, thoracic region: Secondary | ICD-10-CM | POA: Diagnosis not present

## 2023-03-29 DIAGNOSIS — M5134 Other intervertebral disc degeneration, thoracic region: Secondary | ICD-10-CM | POA: Diagnosis not present

## 2023-03-29 DIAGNOSIS — M9901 Segmental and somatic dysfunction of cervical region: Secondary | ICD-10-CM | POA: Diagnosis not present

## 2023-03-29 DIAGNOSIS — M5033 Other cervical disc degeneration, cervicothoracic region: Secondary | ICD-10-CM | POA: Diagnosis not present

## 2023-03-29 DIAGNOSIS — M9902 Segmental and somatic dysfunction of thoracic region: Secondary | ICD-10-CM | POA: Diagnosis not present

## 2023-03-30 DIAGNOSIS — E663 Overweight: Secondary | ICD-10-CM | POA: Diagnosis not present

## 2023-03-30 DIAGNOSIS — R7303 Prediabetes: Secondary | ICD-10-CM | POA: Diagnosis not present

## 2023-03-30 DIAGNOSIS — E7800 Pure hypercholesterolemia, unspecified: Secondary | ICD-10-CM | POA: Diagnosis not present

## 2023-03-30 DIAGNOSIS — Z6826 Body mass index (BMI) 26.0-26.9, adult: Secondary | ICD-10-CM | POA: Diagnosis not present

## 2023-03-30 DIAGNOSIS — F419 Anxiety disorder, unspecified: Secondary | ICD-10-CM | POA: Diagnosis not present

## 2023-03-30 DIAGNOSIS — R413 Other amnesia: Secondary | ICD-10-CM | POA: Diagnosis not present

## 2023-03-30 DIAGNOSIS — Z72820 Sleep deprivation: Secondary | ICD-10-CM | POA: Diagnosis not present

## 2023-03-31 DIAGNOSIS — M9902 Segmental and somatic dysfunction of thoracic region: Secondary | ICD-10-CM | POA: Diagnosis not present

## 2023-03-31 DIAGNOSIS — M9901 Segmental and somatic dysfunction of cervical region: Secondary | ICD-10-CM | POA: Diagnosis not present

## 2023-03-31 DIAGNOSIS — M5134 Other intervertebral disc degeneration, thoracic region: Secondary | ICD-10-CM | POA: Diagnosis not present

## 2023-03-31 DIAGNOSIS — M5033 Other cervical disc degeneration, cervicothoracic region: Secondary | ICD-10-CM | POA: Diagnosis not present

## 2023-04-12 DIAGNOSIS — M5134 Other intervertebral disc degeneration, thoracic region: Secondary | ICD-10-CM | POA: Diagnosis not present

## 2023-04-12 DIAGNOSIS — M9901 Segmental and somatic dysfunction of cervical region: Secondary | ICD-10-CM | POA: Diagnosis not present

## 2023-04-12 DIAGNOSIS — M9902 Segmental and somatic dysfunction of thoracic region: Secondary | ICD-10-CM | POA: Diagnosis not present

## 2023-04-12 DIAGNOSIS — M5033 Other cervical disc degeneration, cervicothoracic region: Secondary | ICD-10-CM | POA: Diagnosis not present

## 2023-04-14 DIAGNOSIS — M5134 Other intervertebral disc degeneration, thoracic region: Secondary | ICD-10-CM | POA: Diagnosis not present

## 2023-04-14 DIAGNOSIS — M9902 Segmental and somatic dysfunction of thoracic region: Secondary | ICD-10-CM | POA: Diagnosis not present

## 2023-04-14 DIAGNOSIS — M5033 Other cervical disc degeneration, cervicothoracic region: Secondary | ICD-10-CM | POA: Diagnosis not present

## 2023-04-14 DIAGNOSIS — M9901 Segmental and somatic dysfunction of cervical region: Secondary | ICD-10-CM | POA: Diagnosis not present

## 2023-04-19 DIAGNOSIS — M9901 Segmental and somatic dysfunction of cervical region: Secondary | ICD-10-CM | POA: Diagnosis not present

## 2023-04-19 DIAGNOSIS — M5134 Other intervertebral disc degeneration, thoracic region: Secondary | ICD-10-CM | POA: Diagnosis not present

## 2023-04-19 DIAGNOSIS — M5033 Other cervical disc degeneration, cervicothoracic region: Secondary | ICD-10-CM | POA: Diagnosis not present

## 2023-04-19 DIAGNOSIS — M9902 Segmental and somatic dysfunction of thoracic region: Secondary | ICD-10-CM | POA: Diagnosis not present

## 2023-04-20 DIAGNOSIS — R7303 Prediabetes: Secondary | ICD-10-CM | POA: Diagnosis not present

## 2023-04-20 DIAGNOSIS — F419 Anxiety disorder, unspecified: Secondary | ICD-10-CM | POA: Diagnosis not present

## 2023-04-20 DIAGNOSIS — E7800 Pure hypercholesterolemia, unspecified: Secondary | ICD-10-CM | POA: Diagnosis not present

## 2023-04-20 DIAGNOSIS — E663 Overweight: Secondary | ICD-10-CM | POA: Diagnosis not present

## 2023-04-20 DIAGNOSIS — R413 Other amnesia: Secondary | ICD-10-CM | POA: Diagnosis not present

## 2023-04-20 DIAGNOSIS — Z72820 Sleep deprivation: Secondary | ICD-10-CM | POA: Diagnosis not present

## 2023-04-20 DIAGNOSIS — Z6826 Body mass index (BMI) 26.0-26.9, adult: Secondary | ICD-10-CM | POA: Diagnosis not present

## 2023-04-25 DIAGNOSIS — M5033 Other cervical disc degeneration, cervicothoracic region: Secondary | ICD-10-CM | POA: Diagnosis not present

## 2023-04-25 DIAGNOSIS — M9901 Segmental and somatic dysfunction of cervical region: Secondary | ICD-10-CM | POA: Diagnosis not present

## 2023-04-25 DIAGNOSIS — M9902 Segmental and somatic dysfunction of thoracic region: Secondary | ICD-10-CM | POA: Diagnosis not present

## 2023-04-25 DIAGNOSIS — M5134 Other intervertebral disc degeneration, thoracic region: Secondary | ICD-10-CM | POA: Diagnosis not present

## 2023-04-28 DIAGNOSIS — M5134 Other intervertebral disc degeneration, thoracic region: Secondary | ICD-10-CM | POA: Diagnosis not present

## 2023-04-28 DIAGNOSIS — M9902 Segmental and somatic dysfunction of thoracic region: Secondary | ICD-10-CM | POA: Diagnosis not present

## 2023-04-28 DIAGNOSIS — M9901 Segmental and somatic dysfunction of cervical region: Secondary | ICD-10-CM | POA: Diagnosis not present

## 2023-04-28 DIAGNOSIS — M5033 Other cervical disc degeneration, cervicothoracic region: Secondary | ICD-10-CM | POA: Diagnosis not present

## 2023-05-02 ENCOUNTER — Ambulatory Visit: Admission: RE | Admit: 2023-05-02 | Payer: Medicare Other | Source: Ambulatory Visit

## 2023-05-02 DIAGNOSIS — Z1231 Encounter for screening mammogram for malignant neoplasm of breast: Secondary | ICD-10-CM

## 2023-05-05 DIAGNOSIS — M5134 Other intervertebral disc degeneration, thoracic region: Secondary | ICD-10-CM | POA: Diagnosis not present

## 2023-05-05 DIAGNOSIS — M9901 Segmental and somatic dysfunction of cervical region: Secondary | ICD-10-CM | POA: Diagnosis not present

## 2023-05-05 DIAGNOSIS — M9902 Segmental and somatic dysfunction of thoracic region: Secondary | ICD-10-CM | POA: Diagnosis not present

## 2023-05-05 DIAGNOSIS — M5033 Other cervical disc degeneration, cervicothoracic region: Secondary | ICD-10-CM | POA: Diagnosis not present

## 2023-05-16 ENCOUNTER — Ambulatory Visit (INDEPENDENT_AMBULATORY_CARE_PROVIDER_SITE_OTHER): Payer: Medicare Other | Admitting: Neurology

## 2023-05-16 DIAGNOSIS — R0683 Snoring: Secondary | ICD-10-CM

## 2023-05-16 DIAGNOSIS — D751 Secondary polycythemia: Secondary | ICD-10-CM

## 2023-05-16 DIAGNOSIS — G4734 Idiopathic sleep related nonobstructive alveolar hypoventilation: Secondary | ICD-10-CM

## 2023-05-16 DIAGNOSIS — R351 Nocturia: Secondary | ICD-10-CM

## 2023-05-16 DIAGNOSIS — G4720 Circadian rhythm sleep disorder, unspecified type: Secondary | ICD-10-CM

## 2023-05-16 DIAGNOSIS — E663 Overweight: Secondary | ICD-10-CM

## 2023-05-16 DIAGNOSIS — G250 Essential tremor: Secondary | ICD-10-CM

## 2023-05-18 DIAGNOSIS — M5033 Other cervical disc degeneration, cervicothoracic region: Secondary | ICD-10-CM | POA: Diagnosis not present

## 2023-05-18 DIAGNOSIS — M9901 Segmental and somatic dysfunction of cervical region: Secondary | ICD-10-CM | POA: Diagnosis not present

## 2023-05-18 DIAGNOSIS — M9902 Segmental and somatic dysfunction of thoracic region: Secondary | ICD-10-CM | POA: Diagnosis not present

## 2023-05-18 DIAGNOSIS — M5134 Other intervertebral disc degeneration, thoracic region: Secondary | ICD-10-CM | POA: Diagnosis not present

## 2023-05-18 NOTE — Procedures (Signed)
Physician Interpretation:     [facility] POLYSOMNOGRAPHY  INTERPRETATION REPORT   STUDY DATE:  05/16/2023     PATIENT NAME:  Rachel [name]         DATE OF BIRTH:  06-May-1951  PATIENT ID:  [id number]    TYPE OF STUDY:  PSG  READING PHYSICIAN: [name], MD, PhD   SCORING TECHNICIAN: Domingo Cocking, RPSGT  Referred by: Rojelio Brenner, PA ? History and Indication for Testing: 72 year old right-handed woman with an underlying medical history of erythrocytosis, suspected secondary polycythemia, and overweight state, who reports snoring and difficulty sleeping. I reviewed your office note from 05/03/2022. Her Epworth sleepiness score is 3 out of 24, fatigue severity score is 20 out of 63.  Height: 61 in Weight: 147 lb (BMI 27) Neck Size: 14 in    MEDICATIONS: Calcitrate/Vitamin D, Vitamin B12, Multivitamins, Probiotic   TECHNICAL DESCRIPTION: A registered sleep technologist was in attendance for the duration of the recording.  Data collection, scoring, video monitoring, and reporting were performed in compliance with the AASM Manual for the Scoring of Sleep and Associated Events; (Hypopnea is scored based on the criteria listed in Section VIII D. 1b in the AASM Manual V2.6 using a 4% oxygen desaturation rule or Hypopnea is scored based on the criteria listed in Section VIII D. 1a in the AASM Manual V2.6 using 3% oxygen desaturation and /or arousal rule).  SLEEP CONTINUITY AND SLEEP ARCHITECTURE:  Lights-out was at 21:14: and lights-on at  04:58:, with a total recording time of 7 hours, 44 min. Total sleep time ( TST) was 292.5 minutes with a decreased sleep efficiency at 63.0%. There was  20.5% REM sleep.    BODY POSITION:  TST was divided  between the following sleep positions: 70.1% supine;  29.9% lateral;  0% prone. Duration of total sleep and percent of total sleep in their respective position is as follows: supine 205 minutes (70%), non-supine 88  minutes (30%); right 70 minutes (24%), left 17 minutes (6%), and prone 00 minutes (0%).  Total supine REM sleep time was 18 minutes (31% of total REM sleep).  Sleep latency was normal at 9.5 minutes.  REM sleep latency was markedly increased at 234.0 minutes. Of the total sleep time, the percentage of stage N1 sleep was 10.9%, which is increased, stage N2 sleep was 62%, which is increased, stage N3 sleep was 6.5%, and REM sleep was 20.5%, which is normal. Wake after sleep onset (WASO) time accounted for 162 with sleep fragmentation noted, ranging from minimal to severe.   RESPIRATORY MONITORING:   Based on CMS criteria (using a 4% oxygen desaturation rule for scoring hypopneas), there were 8 apneas (8 obstructive; 0 central; 0 mixed), and 13 hypopneas.  Apnea index was 1.6. Hypopnea index was 2.7. The apnea-hypopnea index was 4.3/hour overall (6.1 supine, 0 non-supine; 8.0 REM, 25.9 supine REM).  There were 0 respiratory effort-related arousals (RERAs).  The RERA index was 0 events/h. Total respiratory disturbance index (RDI) was 4.3 events/h. RDI results showed: supine RDI  6.1 /h; non-supine RDI 0.0 /h; REM RDI 8.0 /h, supine REM RDI 25.9 /h.   Based on AASM criteria (using a 3% oxygen desaturation and /or arousal rule for scoring hypopneas), there were 8 apneas (8 obstructive; 0 central; 0 mixed), and 29 hypopneas. Apnea index was 1.6. Hypopnea index was 5.9. The apnea-hypopnea index was 7.6 overall (10.0 supine, 4 non-supine; 13.0 REM, 32.4 supine REM).  There were 0 respiratory effort-related arousals (  RERAs).  The RERA index was 0 events/h. Total respiratory disturbance index (RDI) was 7.6 events/h. RDI results showed: supine RDI  10.0 /h; non-supine RDI 2.1 /h; REM RDI 13.0 /h, supine REM RDI 32.4 /h.    OXIMETRY: Oxyhemoglobin Saturation Nadir during sleep was at  75% from a mean of 91%.  Of the Total sleep time (TST)   hypoxemia (=<88%) was present for  75.0 minutes, or 25.7% of total sleep time.    LIMB MOVEMENTS: There were 0 periodic limb movements of sleep (0.0/hr), of which 0 (0.0/hr) were associated with an arousal.   AROUSAL: There were 96 arousals in total, for an arousal index of 20 arousals/hour.  Of these, 24 were identified as respiratory-related arousals (5 /h), 0 were PLM-related arousals (0 /h), and 101 were non-specific arousals (21 /h).   EEG: Review of the EEG showed no abnormal electrical discharges and symmetrical bihemispheric findings.    EKG: The EKG revealed normal sinus rhythm (NSR). The average heart rate during sleep was 60 bpm.   AUDIO/VIDEO REVIEW: The audio and video review did not show any abnormal or unusual behaviors, movements, phonations or vocalizations. The patient took no restroom breaks. No audible snoring was noted.  POST-STUDY QUESTIONNAIRE: Post study, the patient indicated, that sleep was the same as usual.   IMPRESSION:  1. Nocturnal hypoxemia 2. Dysfunctions associated with sleep stages or arousal from sleep  RECOMMENDATIONS:  1. This study does not demonstrate any significant obstructive or central sleep disordered breathing with an AHI of less than 5/hour - Her total AHI was 4.3/h, oxygen saturations nadir was 75% during supine REM sleep.  She did have lower average oxygen saturations during the night with a mean of 91%, and total time below or at 88% saturation of 75 minutes, indicating nocturnal hypoxemia.  She did not have any significant snoring.  While treatment with a positive airway pressure device, such as CPAP or autoPAP is not indicated based on this test, the patient will be advised to seek further evaluation through a pulmonologist for an underlying pulmonary cause of her lower oxygen saturations. 2. This study shows at times significant sleep fragmentation and abnormal sleep stage percentages; these are nonspecific findings and per se do not signify an intrinsic sleep disorder or a cause for the patient's sleep-related symptoms.  Causes include (but are not limited to) the first night effect of the sleep study, circadian rhythm disturbances, medication effect or an underlying mood disorder or medical problem.  3. The patient should be cautioned not to drive, work at heights, or operate dangerous or heavy equipment when tired or sleepy. Review and reiteration of good sleep hygiene measures should be pursued with any patient. 4. The patient will be advised to follow up with the referring provider, who will be notified of the test results.   I certify that I have reviewed the entire raw data recording prior to the issuance of this report in accordance with the Standards of Accreditation of the American Academy of Sleep Medicine (AASM).  [name], MD, PhD Medical Director, [facility]) Diplomat, ABPN (Neurology and Sleep)            Technical Report:   General Information  Name: Jozalynn, Crihfield BMI: 27.78 Physician: [name], MD  ID: [id number] Height: 61.0 in Technician: Domingo Cocking, RPSGT  Sex: Unknown Weight: 147.0 lb Record: [id number]  Age: 72 [03/22/1951] Date: 05/16/2023    Medical & Medication History    Ms.  Degross is a 72 year old right-handed woman with an underlying medical history of erythrocytosis, suspected secondary polycythemia, and overweight state, who presents for a new problem visit of tremors. She is referred by Rojelio Brenner, PA. I had evaluated her in September 2023 for concern for sleep apnea. She did not pursue sleep testing at the time. She is does not sleep well. She could not pursue her sleep study because of her mom's health and taking care of her mom. She would like to schedule a sleep study at this time as she continues to have sleep disruption, chronic difficulty with sleep consolidation and poor sleep quality.  Calcitrate/Vitamin D, Vitamin B12, Multivitamins, Probiotic   Sleep Disorder      Comments   Patient arrived for a  diagnostic polysomnogram. Procedure explained and all questions answered. Standard paste setup without complications. Patient had highly fragmented sleep and significantly reduced sleep efficiency at 63%. Patient slept supine, left, and right. No significant snoring heard. No significant respiratory events observed. No significant cardiac arrhythmias observed. No significant PLMS observed. No nocturia.    Lights out: 09:14:49 PM Lights on: 04:58:29 AM   Time Total Supine Side Prone Upright  Recording (TRT) 7h 44.36m 5h 14.61m 2h 30.44m 0h 0.53m 0h 0.55m  Sleep (TST) 4h 52.69m 3h 25.101m 1h 27.35m 0h 0.50m 0h 0.77m   Latency N1 N2 N3 REM Onset Per. Slp. Eff.  Actual 0h 0.52m 0h 13.6m 5h 2.5m 3h 54.57m 0h 9.86m 3h 2.6m 63.04%   Stg Dur Wake N1 N2 N3 REM  Total 171.5 32.0 181.5 19.0 60.0  Supine 109.0 17.5 150.0 19.0 18.5  Side 62.5 14.5 31.5 0.0 41.5  Prone 0.0 0.0 0.0 0.0 0.0  Upright 0.0 0.0 0.0 0.0 0.0   Stg % Wake N1 N2 N3 REM  Total 37.0 10.9 62.1 6.5 20.5  Supine 23.5 6.0 51.3 6.5 6.3  Side 13.5 5.0 10.8 0.0 14.2  Prone 0.0 0.0 0.0 0.0 0.0  Upright 0.0 0.0 0.0 0.0 0.0     Apnea Summary Sub Supine Side Prone Upright  Total 8 Total 8 8 0 0 0    REM 2 2 0 0 0    NREM 6 6 0 0 0  Obs 8 REM 2 2 0 0 0    NREM 6 6 0 0 0  Mix 0 REM 0 0 0 0 0    NREM 0 0 0 0 0  Cen 0 REM 0 0 0 0 0    NREM 0 0 0 0 0   Rera Summary Sub Supine Side Prone Upright  Total 0 Total 0 0 0 0 0    REM 0 0 0 0 0    NREM 0 0 0 0 0   Hypopnea Summary Sub Supine Side Prone Upright  Total 29 Total 29 26 3  0 0    REM 11 8 3  0 0    NREM 18 18 0 0 0   4% Hypopnea Summary Sub Supine Side Prone Upright  Total (4%) 13 Total 13 13 0 0 0    REM 6 6 0 0 0    NREM 7 7 0 0 0     AHI Total Obs Mix Cen  7.59 Apnea 1.64 1.64 0.00 0.00   Hypopnea 5.95 -- -- --  4.31 Hypopnea (4%) 2.67 -- -- --    Total Supine Side Prone Upright  Position AHI 7.59 9.95 2.06 0.00 0.00  REM AHI 13.00   NREM AHI 6.19  Position RDI 7.59 9.95  2.06 0.00 0.00  REM RDI 13.00   NREM RDI 6.19    4% Hypopnea Total Supine Side Prone Upright  Position AHI (4%) 4.31 6.15 0.00 0.00 0.00  REM AHI (4%) 8.00   NREM AHI (4%) 3.35   Position RDI (4%) 4.31 6.15 0.00 0.00 0.00  REM RDI (4%) 8.00   NREM RDI (4%) 3.35    Desaturation Information Threshold: 2% <100% <90% <80% <70% <60% <50% <40%  Supine 136.0 102.0 4.0 0.0 0.0 0.0 0.0  Side 45.0 0.0 0.0 0.0 0.0 0.0 0.0  Prone 0.0 0.0 0.0 0.0 0.0 0.0 0.0  Upright 0.0 0.0 0.0 0.0 0.0 0.0 0.0  Total 181.0 102.0 4.0 0.0 0.0 0.0 0.0  Index 23.9 13.5 0.5 0.0 0.0 0.0 0.0   Threshold: 3% <100% <90% <80% <70% <60% <50% <40%  Supine 58.0 47.0 4.0 0.0 0.0 0.0 0.0  Side 11.0 0.0 0.0 0.0 0.0 0.0 0.0  Prone 0.0 0.0 0.0 0.0 0.0 0.0 0.0  Upright 0.0 0.0 0.0 0.0 0.0 0.0 0.0  Total 69.0 47.0 4.0 0.0 0.0 0.0 0.0  Index 9.1 6.2 0.5 0.0 0.0 0.0 0.0   Threshold: 4% <100% <90% <80% <70% <60% <50% <40%  Supine 31.0 27.0 4.0 0.0 0.0 0.0 0.0  Side 2.0 0.0 0.0 0.0 0.0 0.0 0.0  Prone 0.0 0.0 0.0 0.0 0.0 0.0 0.0  Upright 0.0 0.0 0.0 0.0 0.0 0.0 0.0  Total 33.0 27.0 4.0 0.0 0.0 0.0 0.0  Index 4.4 3.6 0.5 0.0 0.0 0.0 0.0   Threshold: 3% <100% <90% <80% <70% <60% <50% <40%  Supine 58 47 4 0 0 0 0  Side 11 0 0 0 0 0 0  Prone 0 0 0 0 0 0 0  Upright 0 0 0 0 0 0 0  Total 69 47 4 0 0 0 0   Awakening/Arousal Information # of Awakenings 50  Wake after sleep onset 162.13m  Wake after persistent sleep 36.60m   Arousal Assoc. Arousals Index  Apneas 8 1.6  Hypopneas 16 3.3  Leg Movements 0 0.0  Snore 0 0.0  PTT Arousals 0 0.0  Spontaneous 102 20.9  Total 126 25.8  Leg Movement Information PLMS LMs Index  Total LMs during PLMS 0 0.0  LMs w/ Microarousals 0 0.0   LM LMs Index  w/ Microarousal 0 0.0  w/ Awakening 0 0.0  w/ Resp Event 0 0.0  Spontaneous 1 0.2  Total 1 0.2     Desaturation threshold setting: 3% Minimum desaturation setting: 10 seconds SaO2 nadir: 75% The longest event was a 39 sec  obstructive Hypopnea with a minimum SaO2 of 76%. The lowest SaO2 was 76% associated with a 39 sec obstructive Hypopnea. EKG Rates EKG Avg Max Min  Awake 68 101 55  Asleep 60 104 54  EKG Events: N/A

## 2023-05-19 DIAGNOSIS — M9902 Segmental and somatic dysfunction of thoracic region: Secondary | ICD-10-CM | POA: Diagnosis not present

## 2023-05-19 DIAGNOSIS — M9901 Segmental and somatic dysfunction of cervical region: Secondary | ICD-10-CM | POA: Diagnosis not present

## 2023-05-19 DIAGNOSIS — M5033 Other cervical disc degeneration, cervicothoracic region: Secondary | ICD-10-CM | POA: Diagnosis not present

## 2023-05-19 DIAGNOSIS — M5134 Other intervertebral disc degeneration, thoracic region: Secondary | ICD-10-CM | POA: Diagnosis not present

## 2023-05-24 DIAGNOSIS — M5134 Other intervertebral disc degeneration, thoracic region: Secondary | ICD-10-CM | POA: Diagnosis not present

## 2023-05-24 DIAGNOSIS — M9901 Segmental and somatic dysfunction of cervical region: Secondary | ICD-10-CM | POA: Diagnosis not present

## 2023-05-24 DIAGNOSIS — M5033 Other cervical disc degeneration, cervicothoracic region: Secondary | ICD-10-CM | POA: Diagnosis not present

## 2023-05-24 DIAGNOSIS — M9902 Segmental and somatic dysfunction of thoracic region: Secondary | ICD-10-CM | POA: Diagnosis not present

## 2023-05-25 ENCOUNTER — Telehealth: Payer: Self-pay | Admitting: *Deleted

## 2023-05-25 NOTE — Telephone Encounter (Signed)
-----   Message from [name] sent at 05/25/2023  2:09 PM EDT ----- Patient referred by hematology, seen by me on 02/07/23, patient had a diagnostic PSG on 05/16/23.   Please call and notify the patient that the recent sleep study did not show any significant sleep apnea. She did not have any significant snoring. While her AHI was below 5/hour, she did have recurrent oxygen desaturations throughout the night with an average O2 of 91% and time below 88% saturation of 75 minutes. I recommend, that she make an appointment with her PCP to discuss lower oxygen saturations at night and a referral to pulmonology, to investigate if there is an underlying pulmonary cause.  At this juncture, she can follow up with her PCP and other providers as scheduled/planned.   Thanks,  [name], MD, PhD [facility])

## 2023-05-25 NOTE — Telephone Encounter (Signed)
Spoke to pt gave sleep study results Gave Dr Frances Furbish recommendation for pt to  make an appointment with her PCP to discuss lower oxygen saturations at night and a referral to pulmonology, to investigate if there is an underlying pulmonary cause. Pt expressed understanding . Per pt request sent PCP sleep study results .

## 2023-05-30 IMAGING — MG MM DIGITAL SCREENING BILAT W/ TOMO AND CAD
8 series · 9 of 24 positions shown · non-contrast
Comparison: Previous exam(s).

CLINICAL DATA: Screening.

EXAM:
DIGITAL SCREENING BILATERAL MAMMOGRAM WITH TOMOSYNTHESIS AND CAD
TECHNIQUE: Bilateral screening digital craniocaudal and mediolateral oblique
mammograms were obtained. Bilateral screening digital breast
tomosynthesis was performed. The images were evaluated with
computer-aided detection.

[L CC synth-2D]
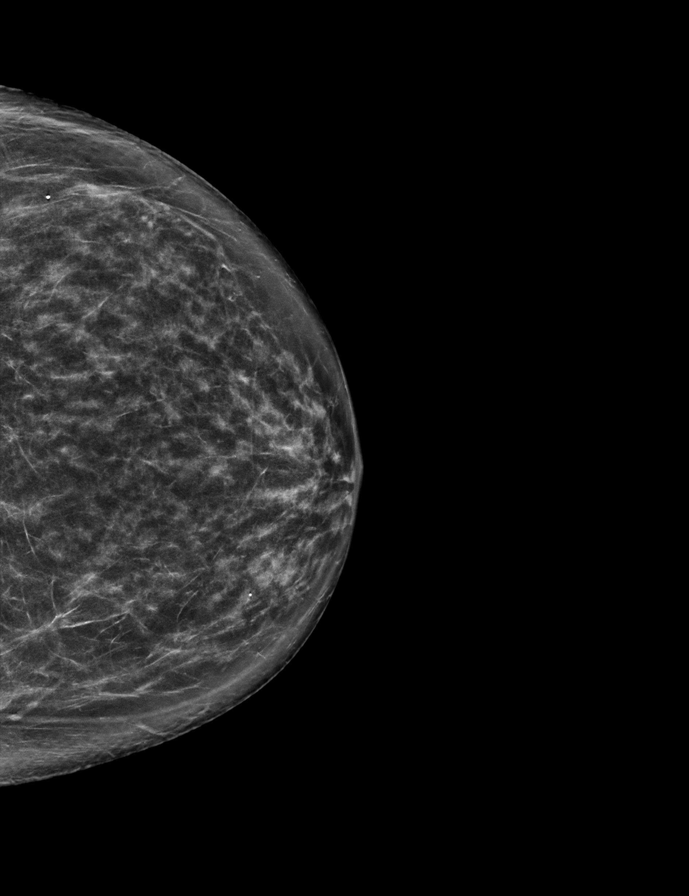

[R MLO synth-2D]
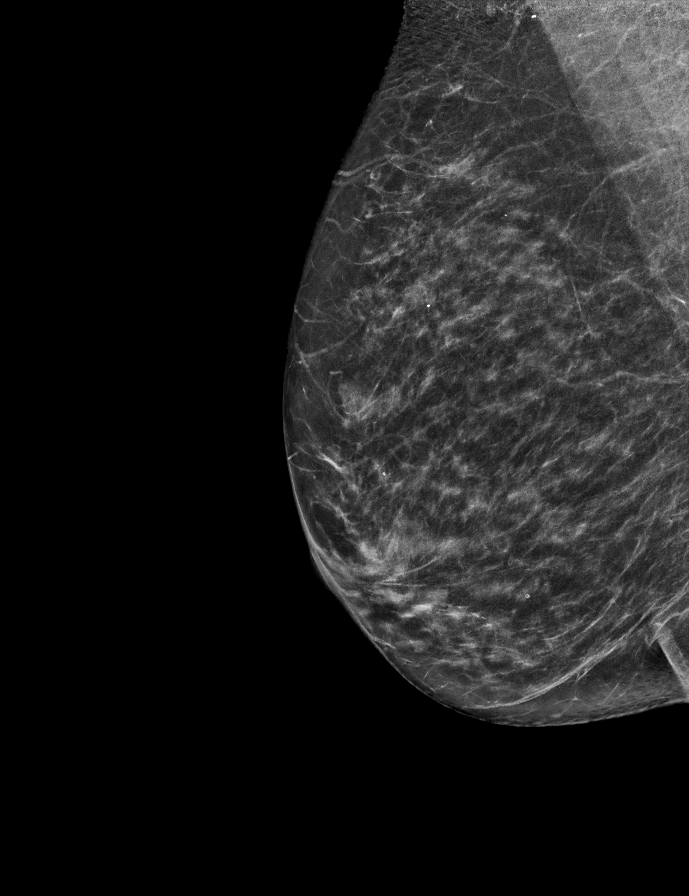

[R CC synth-2D]
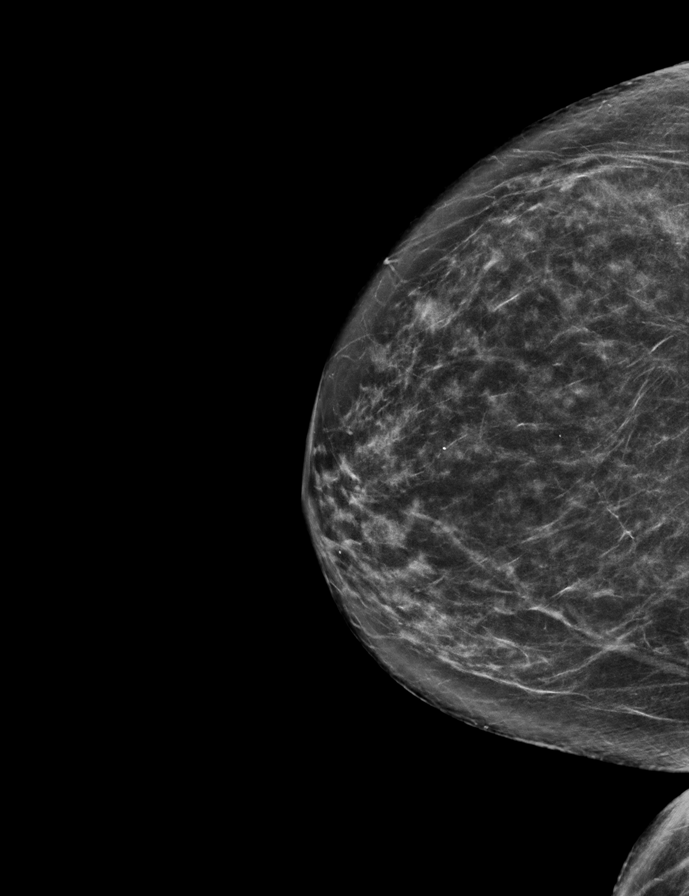

[L MLO synth-2D]
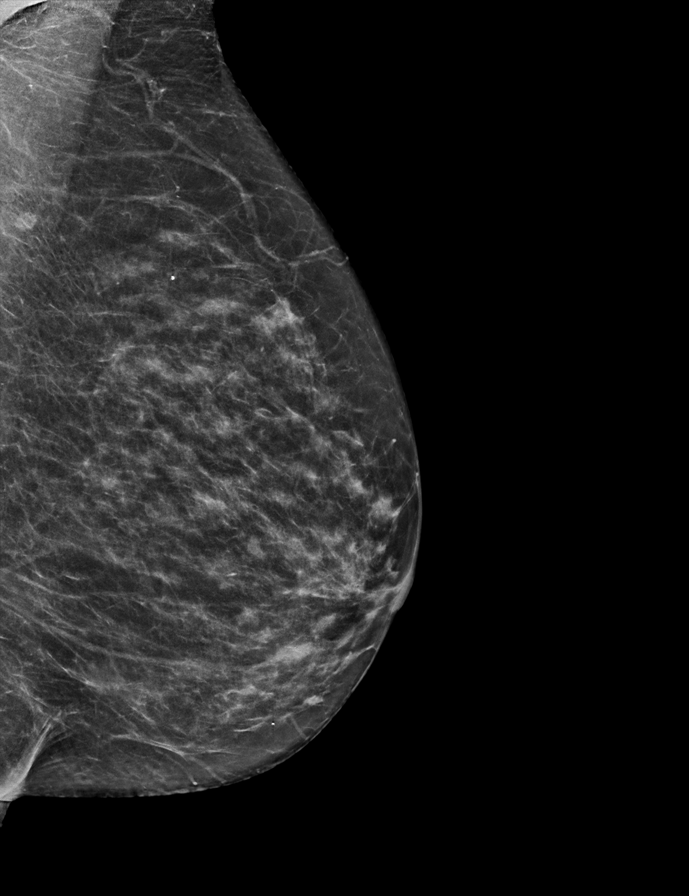

[L MLO tomo · 2 of 63 frames shown]
[frame 21/63]
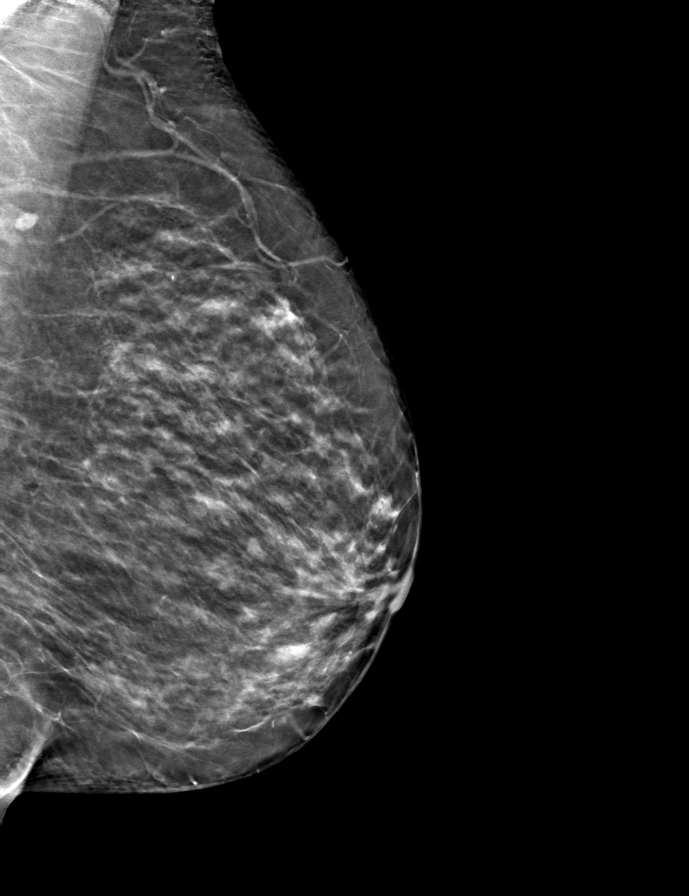
[frame 32/63]
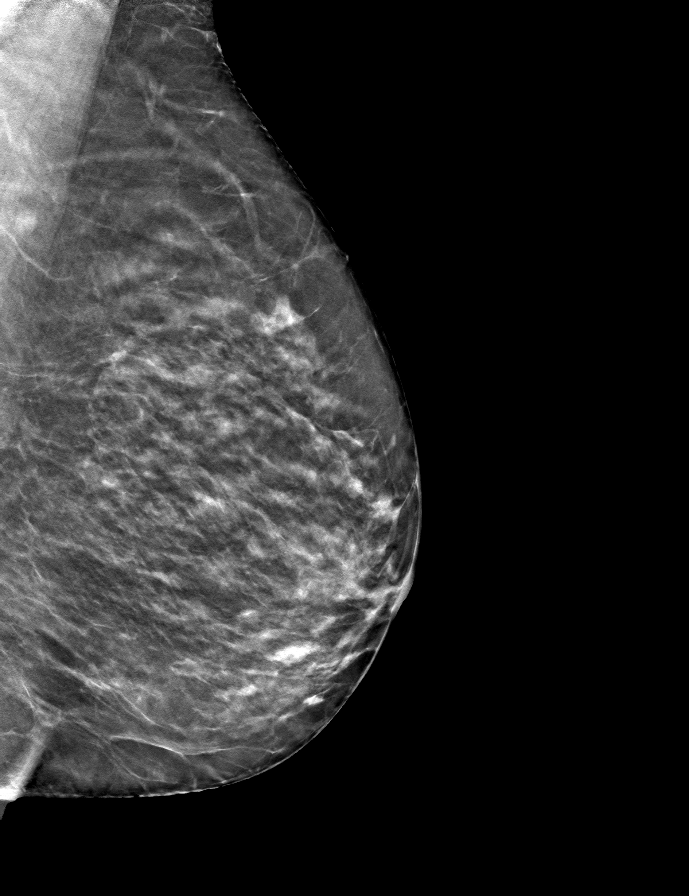

[R MLO tomo · tomo slice 31/61.0]
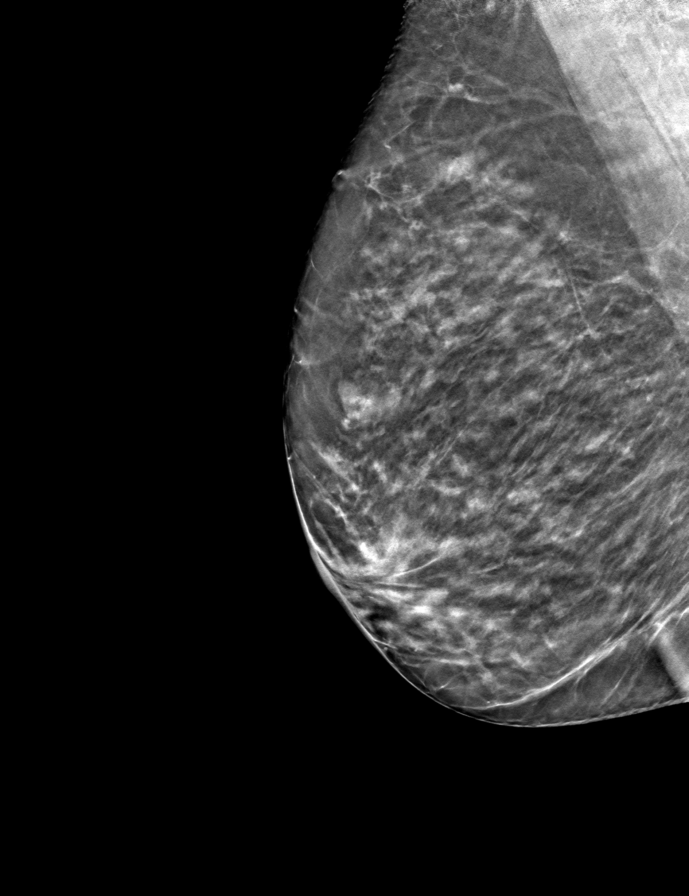

[L CC tomo · tomo slice 33/65.0]
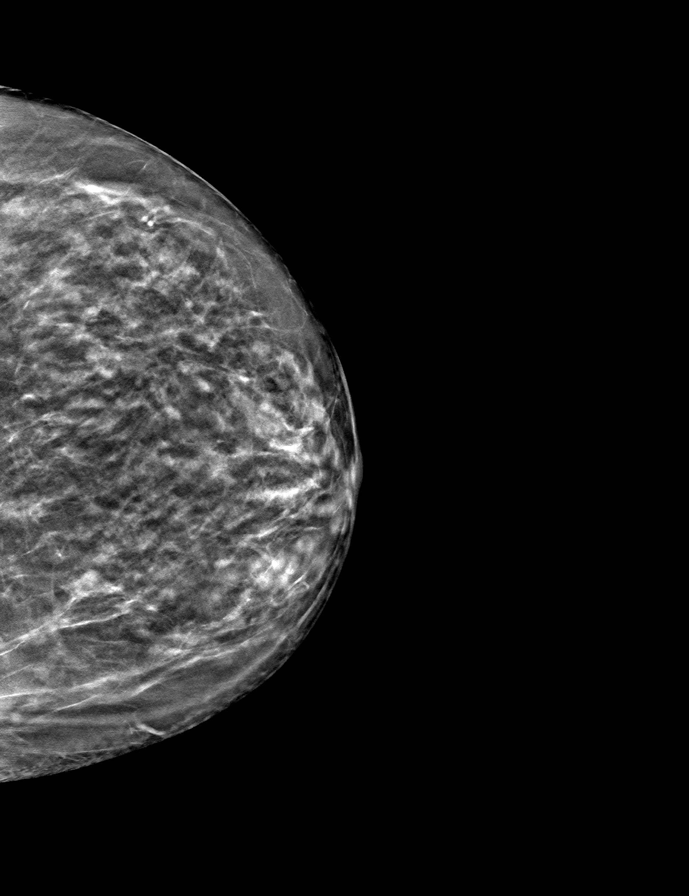

[R CC tomo · tomo slice 34/67.0]
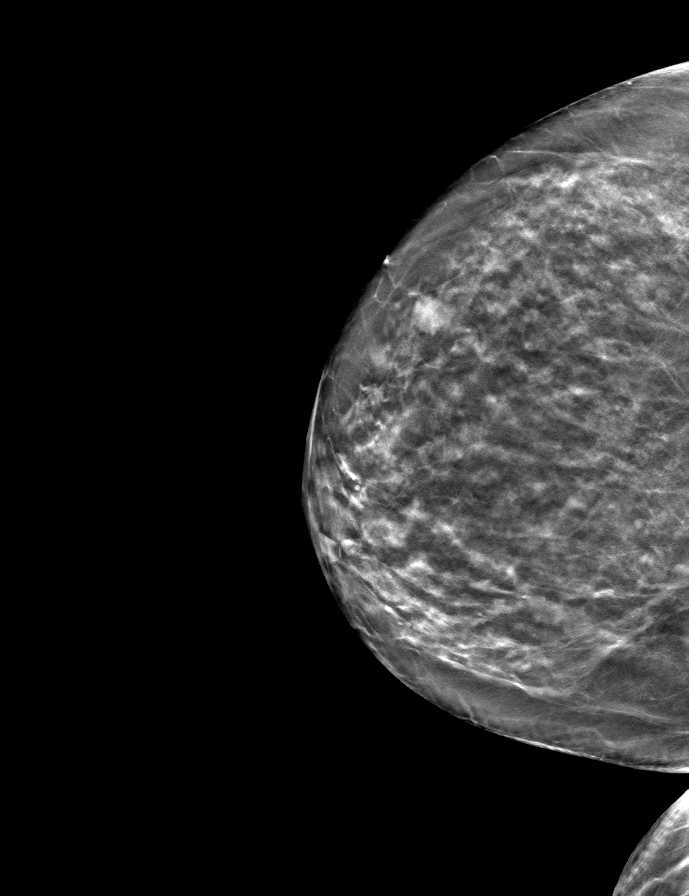

[9 of 24 positions shown; findings below may reference images not displayed]

ACR Breast Density Category c: The breast tissue is heterogeneously
dense, which may obscure small masses.
FINDINGS: There are no findings suspicious for malignancy.
IMPRESSION: No mammographic evidence of malignancy. A result letter of this
screening mammogram will be mailed directly to the patient.

RECOMMENDATION:
Screening mammogram in one year. (Code:Q3-W-BC3)

BI-RADS CATEGORY  1: Negative.

## 2023-06-08 DIAGNOSIS — Z6826 Body mass index (BMI) 26.0-26.9, adult: Secondary | ICD-10-CM | POA: Diagnosis not present

## 2023-06-08 DIAGNOSIS — R413 Other amnesia: Secondary | ICD-10-CM | POA: Diagnosis not present

## 2023-06-08 DIAGNOSIS — E7800 Pure hypercholesterolemia, unspecified: Secondary | ICD-10-CM | POA: Diagnosis not present

## 2023-06-08 DIAGNOSIS — E663 Overweight: Secondary | ICD-10-CM | POA: Diagnosis not present

## 2023-06-08 DIAGNOSIS — Z72820 Sleep deprivation: Secondary | ICD-10-CM | POA: Diagnosis not present

## 2023-06-08 DIAGNOSIS — R7303 Prediabetes: Secondary | ICD-10-CM | POA: Diagnosis not present

## 2023-06-08 DIAGNOSIS — F419 Anxiety disorder, unspecified: Secondary | ICD-10-CM | POA: Diagnosis not present

## 2023-06-10 ENCOUNTER — Inpatient Hospital Stay: Payer: Medicare Other | Attending: Hematology

## 2023-06-10 DIAGNOSIS — G4730 Sleep apnea, unspecified: Secondary | ICD-10-CM | POA: Diagnosis not present

## 2023-06-10 DIAGNOSIS — D751 Secondary polycythemia: Secondary | ICD-10-CM | POA: Insufficient documentation

## 2023-06-10 LAB — CBC WITH DIFFERENTIAL/PLATELET
Abs Immature Granulocytes: 0.01 10*3/uL (ref 0.00–0.07)
Basophils Absolute: 0 10*3/uL (ref 0.0–0.1)
Basophils Relative: 0 %
Eosinophils Absolute: 0 10*3/uL (ref 0.0–0.5)
Eosinophils Relative: 1 %
HCT: 48.5 % — ABNORMAL HIGH (ref 36.0–46.0)
Hemoglobin: 15.4 g/dL — ABNORMAL HIGH (ref 12.0–15.0)
Immature Granulocytes: 0 %
Lymphocytes Relative: 36 %
Lymphs Abs: 1.8 10*3/uL (ref 0.7–4.0)
MCH: 30.1 pg (ref 26.0–34.0)
MCHC: 31.8 g/dL (ref 30.0–36.0)
MCV: 94.9 fL (ref 80.0–100.0)
Monocytes Absolute: 0.3 10*3/uL (ref 0.1–1.0)
Monocytes Relative: 7 %
Neutro Abs: 2.8 10*3/uL (ref 1.7–7.7)
Neutrophils Relative %: 56 %
Platelets: 200 10*3/uL (ref 150–400)
RBC: 5.11 MIL/uL (ref 3.87–5.11)
RDW: 12.2 % (ref 11.5–15.5)
WBC: 4.9 10*3/uL (ref 4.0–10.5)
nRBC: 0 % (ref 0.0–0.2)

## 2023-06-16 ENCOUNTER — Inpatient Hospital Stay (HOSPITAL_BASED_OUTPATIENT_CLINIC_OR_DEPARTMENT_OTHER): Payer: Medicare Other | Admitting: Oncology

## 2023-06-16 VITALS — BP 126/54 | HR 72 | Temp 99.2°F | Resp 18 | Ht 61.0 in | Wt 145.4 lb

## 2023-06-16 DIAGNOSIS — G4730 Sleep apnea, unspecified: Secondary | ICD-10-CM | POA: Diagnosis not present

## 2023-06-16 DIAGNOSIS — D751 Secondary polycythemia: Secondary | ICD-10-CM

## 2023-06-16 NOTE — Progress Notes (Signed)
[facility] [address] [place], [place] [address]   CLINIC:  Medical Oncology/Hematology  PCP:  Donetta Potts, MD [address] [place] [address] [phone]   REASON FOR VISIT:  Follow-up for erythrocytosis (suspected secondary polycythemia)   PRIOR THERAPY: None   CURRENT THERAPY: Observation  INTERVAL HISTORY:   Rachel [name] is a 72 year old female who returns to clinic today for follow-up for erythrocytosis likely secondary to polycythemia in the setting of sleep apnea.  She was last seen in clinic on 12/08/2022.  In the interim, she denies any hospitalizations, surgeries or changes in her baseline health.  She was evaluated by Dr. Frances Furbish for possible sleep apnea which showed nocturnal hypoxemia and dysfunction associated with sleep stages or arousal.  It was recommended she follow-up with a pulmonologist for an underlying pulmonary disorder.  CPAP or AutoPap was not indicated.  At today's visit, she reports doing well.  Appetite and energy levels are 50%.  She denies any pain.  She continues to report poor sleep hygiene.  Denies any  erythromelalgia, aquagenic pruritus, vasomotor symptoms, strokelike symptoms, or B symptoms.      ASSESSMENT & PLAN:  1.  Erythrocytosis/secondary polycythemia - She has had persistently elevated hemoglobin/hematocrit since at least 2016, stable within baseline range of Hgb 15.0-16.0 and HCT 47.5-49.0 - Lifelong non-smoker.  No history of cardiac or pulmonary disease. - Seen by your [facility] for sleep study which showed nocturnal hypoxemia and dysfunction associated with sleep stages over arousal.  CPAP/AutoPap was not indicated.  Recommended referral to pulmonology. - Hematology work-up (05/29/2021) showed normal erythropoietin 6.8, negative JAK2 V617F, CALR, MPL, and JAK2 E12-15 - Most recent labs from 06/10/2023 show hemoglobin of 15.4 and hematocrit of 48.5.  This is slightly improved from her last visit.  Differential is  normal.  Given improvement of her labs, there is no indication for phlebotomy at this time. -Previously discussed phlebotomy for hematocrit greater than 54. -Recommend follow-up in 6 months with labs a few days before and a virtual visit.  Discussed if labs remain stable, she likely can be discharged from our clinic and have follow-up with PCP.  2. Nocturnal hypoxemia -Per sleep study results. -Referral sent to [facility] pulmonology for further evaluation.    PLAN SUMMARY: >> Referral to pulmonology based on sleep study results. >> Recommend follow-up in 6 months with labs a few days before (CBC with differential) and telephone call. >> If labs remain stable and/or normalized, patient can be discharged from our clinic and follow-up with PCP.    REVIEW OF SYSTEMS:   Review of Systems  Constitutional:  Positive for fatigue.  Psychiatric/Behavioral:  Positive for sleep disturbance.      PHYSICAL EXAM:  ECOG PERFORMANCE STATUS: 1 - Symptomatic but completely ambulatory  Vitals:   06/16/23 1001  BP: (!) 126/54  Pulse: 72  Resp: 18  Temp: 99.2 F (37.3 C)  SpO2: 97%   Filed Weights   06/16/23 1001  Weight: 145 lb 6.4 oz (66 kg)   Physical Exam Constitutional:      Appearance: Normal appearance.  Cardiovascular:     Rate and Rhythm: Normal rate and regular rhythm.  Pulmonary:     Effort: Pulmonary effort is normal.     Breath sounds: Normal breath sounds.  Abdominal:     General: Bowel sounds are normal.     Palpations: Abdomen is soft.  Musculoskeletal:        General: No swelling. Normal range of  motion.  Neurological:     Mental Status: She is alert and oriented to person, place, and time. Mental status is at baseline.     PAST MEDICAL/SURGICAL HISTORY:  Past Medical History:  Diagnosis Date   High cholesterol    Past Surgical History:  Procedure Laterality Date   ABDOMINAL HYSTERECTOMY     CHOLECYSTECTOMY N/A 01/17/2015   Procedure: LAPAROSCOPIC  CHOLECYSTECTOMY;  Surgeon: Franky Macho Md, MD;  Location: [facility];  Service: General;  Laterality: N/A;   MOHS SURGERY  07/2020   Dr. Jeannine Boga   MOUTH SURGERY  03/2020   mass in left side at top- Dr.Owsley   TONSILLECTOMY     TUBAL LIGATION      SOCIAL HISTORY:  Social History   Socioeconomic History   Marital status: Widowed    Spouse name: Not on file   Number of children: Not on file   Years of education: Not on file   Highest education level: Not on file  Occupational History   Not on file  Tobacco Use   Smoking status: Never   Smokeless tobacco: Never  Vaping Use   Vaping status: Never Used  Substance and Sexual Activity   Alcohol use: No   Drug use: No   Sexual activity: Never    Birth control/protection: Surgical  Other Topics Concern   Not on file  Social History Narrative   Caffeine NO.     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    Social Determinants of Health   Financial Resource Strain: Low Risk  (05/28/2021)   Overall Financial Resource Strain (CARDIA)    Difficulty of Paying Living Expenses: Not hard at all  Food Insecurity: No Food Insecurity (05/28/2021)   Hunger Vital Sign    Worried About Running Out of Food in the Last Year: Never true    Ran Out of Food in the Last Year: Never true  Transportation Needs: No Transportation Needs (05/28/2021)   PRAPARE - Administrator, Civil Service (Medical): No    Lack of Transportation (Non-Medical): No  Physical Activity: Sufficiently Active (05/28/2021)   Exercise Vital Sign    Days of Exercise per Week: 3 days    Minutes of Exercise per Session: 60 min  Stress: No Stress Concern Present (05/28/2021)   [employer] of Occupational Health - Occupational Stress Questionnaire    Feeling of Stress : Not at all  Social Connections: Moderately Integrated (05/28/2021)   Social Connection and Isolation Panel [NHANES]    Frequency of Communication with Friends and  Family: More than three times a week    Frequency of Social Gatherings with Friends and Family: More than three times a week    Attends Religious Services: More than 4 times per year    Active Member of [employer] or Organizations: Yes    Attends Banker Meetings: More than 4 times per year    Marital Status: Widowed  Intimate Partner Violence: Not At Risk (05/28/2021)   Humiliation, Afraid, Rape, and Kick questionnaire    Fear of Current or Ex-Partner: No    Emotionally Abused: No    Physically Abused: No    Sexually Abused: No    FAMILY HISTORY:  Family History  Problem Relation Age of Onset   Hypertension Mother    Tremor Mother    Thyroid disease Mother    Cancer Father    Liver cancer Father  Alzheimer's disease Father    Diabetes Paternal Aunt    Heart disease Paternal Aunt    Brain cancer Paternal Uncle    Parkinson's disease Neg Hx    Heart attack Neg Hx    Stroke Neg Hx     CURRENT MEDICATIONS:  Outpatient Encounter Medications as of 06/16/2023  Medication Sig Note   pravastatin (PRAVACHOL) 20 MG tablet 1 tablet Orally Once a day for 30 days    Calcium Carbonate-Vit D-Min (CALCIUM 600+D3 PLUS MINERALS) 600-800 MG-UNIT CHEW 1 tablet Orally Twice a day    Calcium Citrate-Vitamin D (CALCITRATE/VITAMIN D PO) Take 2 tablets by mouth daily. 800/1000u    cyanocobalamin (VITAMIN B12) 1000 MCG tablet Take 1,000 mcg by mouth daily.    IBUPROFEN PO Take 1 tablet by mouth as needed. 06/17/2022: 200mg     Multiple Vitamins-Minerals (MULTIVITAMIN ADULTS PO) Take 2 tablets by mouth daily. MVI with omega 3 Natures Made    Probiotic Product (PROBIOTIC DAILY PO) Take by mouth.    No facility-administered encounter medications on file as of 06/16/2023.    ALLERGIES:  Allergies  Allergen Reactions   Caffeine     "makes me very jittery"   E-Mycin [Erythromycin] Rash    LABORATORY DATA:  I have reviewed the labs as listed.  CBC    Component Value Date/Time   WBC 4.9  06/10/2023 1104   RBC 5.11 06/10/2023 1104   HGB 15.4 (H) 06/10/2023 1104   HCT 48.5 (H) 06/10/2023 1104   PLT 200 06/10/2023 1104   MCV 94.9 06/10/2023 1104   MCH 30.1 06/10/2023 1104   MCHC 31.8 06/10/2023 1104   RDW 12.2 06/10/2023 1104   LYMPHSABS 1.8 06/10/2023 1104   MONOABS 0.3 06/10/2023 1104   EOSABS 0.0 06/10/2023 1104   BASOSABS 0.0 06/10/2023 1104      Latest Ref Rng & Units 04/14/2021    8:56 AM 01/13/2015    9:00 AM  CMP  Glucose 65 - 99 mg/dL 92  865   BUN 7 - 25 mg/dL 15  20   Creatinine 7.84 - 1.00 mg/dL 6.96  2.95   Sodium 284 - 146 mmol/L 141  142   Potassium 3.5 - 5.3 mmol/L 4.8  4.9   Chloride 98 - 110 mmol/L 104  104   CO2 20 - 32 mmol/L 27  32   Calcium 8.6 - 10.4 mg/dL 9.4  9.5   Total Protein 6.1 - 8.1 g/dL 7.0  7.4   Total Bilirubin 0.2 - 1.2 mg/dL 0.6  0.7   Alkaline Phos 39 - 117 U/L  51   AST 10 - 35 U/L 16  21   ALT 6 - 29 U/L 13  17     DIAGNOSTIC IMAGING:  I have independently reviewed the relevant imaging and discussed with the patient.   WRAP UP:  All questions were answered. The patient knows to call the clinic with any problems, questions or concerns.  Medical decision making: moderate  Time spent on visit: I spent 20 minutes dedicated to the care of this patient (face-to-face and non-face-to-face) on the date of the encounter to include what is described in the assessment and plan.  Mauro Kaufmann, NP  06/16/23 10:16 AM

## 2023-08-04 ENCOUNTER — Ambulatory Visit (HOSPITAL_BASED_OUTPATIENT_CLINIC_OR_DEPARTMENT_OTHER): Payer: Medicare Other | Admitting: Pulmonary Disease

## 2023-08-04 ENCOUNTER — Encounter (HOSPITAL_BASED_OUTPATIENT_CLINIC_OR_DEPARTMENT_OTHER): Payer: Self-pay

## 2023-08-04 ENCOUNTER — Encounter (HOSPITAL_BASED_OUTPATIENT_CLINIC_OR_DEPARTMENT_OTHER): Payer: Self-pay | Admitting: Pulmonary Disease

## 2023-08-04 ENCOUNTER — Ambulatory Visit (HOSPITAL_BASED_OUTPATIENT_CLINIC_OR_DEPARTMENT_OTHER): Payer: Medicare Other

## 2023-08-04 VITALS — BP 122/78 | HR 88 | Resp 16 | Ht 61.0 in | Wt 144.6 lb

## 2023-08-04 DIAGNOSIS — R0902 Hypoxemia: Secondary | ICD-10-CM | POA: Diagnosis not present

## 2023-08-04 DIAGNOSIS — I2723 Pulmonary hypertension due to lung diseases and hypoxia: Secondary | ICD-10-CM

## 2023-08-04 DIAGNOSIS — G4734 Idiopathic sleep related nonobstructive alveolar hypoventilation: Secondary | ICD-10-CM | POA: Diagnosis not present

## 2023-08-04 DIAGNOSIS — D751 Secondary polycythemia: Secondary | ICD-10-CM | POA: Diagnosis not present

## 2023-08-04 NOTE — Patient Instructions (Addendum)
X Amb sat  X ONO on rooma air  X CXR -2 V  X Echocardiogram

## 2023-08-04 NOTE — Assessment & Plan Note (Signed)
She had significant nocturnal hypoxia about 25% total sleep time -etiology is unclear. We will do a pulmonary workup including a chest x-ray looking for occult fibrosis and an echocardiogram to rule out pulmonary hypertension as possible etiologies.  We will recheck nocturnal oximetry on room air and if hypoxia is significant we will start her on nocturnal oxygen.  Will follow her symptoms and recheck hemoglobin after 2 to 3 months of oxygen use

## 2023-08-04 NOTE — Addendum Note (Signed)
Addended by: Jama Flavors on: 08/04/2023 09:42 AM   Modules accepted: Orders

## 2023-08-04 NOTE — Assessment & Plan Note (Signed)
Will see if oxygen supplementation during Sleep brings down the hemoglobin level

## 2023-08-04 NOTE — Progress Notes (Signed)
Subjective:    Patient ID: Rachel [name], female    DOB: Sep 11, 1951, 72 y.o.   MRN: [id number]  HPI  72 year old woman referred for evaluation of nocturnal hypoxia and polycythemia. She reports that elevated hemoglobin level has been noted since 2016, more recently hemoglobin level was 15.4. She did not sleep well so she was first referred to neurologist and nocturnal sleep study was performed this does not show any evidence of sleep apnea but showed nocturnal hypoxia with desaturation less than 88% about 25% of total sleep time. She reports waking up at 5 AM all her work life. She denies excessive daytime somnolence, Epworth sleepiness score is 4. Bedtime is between 10 PM and midnight, sleep latency about 30 minutes.  She sleeps on her back and ends up on her left side using 1-2 pillows.  Occasionally she will wake up at 2:30 AM and is not able to fall back asleep.  More often she will stay in bed until 6 AM There is no history suggestive of cataplexy, sleep paralysis or parasomnias  There is no history of smoking or childhood history of asthma.   Significant tests/ events reviewed  05/2023 NPSG >>AHI 4.3/h Saturation Nadir during sleep was at  75% from a mean of 91%.  Of the Total sleep time (TST)   hypoxemia (=<88%) was present for  75.0 minutes, or 25.7% of total sleep time.   Past Medical History:  Diagnosis Date   High cholesterol    Past Surgical History:  Procedure Laterality Date   ABDOMINAL HYSTERECTOMY     CHOLECYSTECTOMY N/A 01/17/2015   Procedure: LAPAROSCOPIC CHOLECYSTECTOMY;  Surgeon: Franky Macho Md, MD;  Location: [facility];  Service: General;  Laterality: N/A;   MOHS SURGERY  07/2020   Dr. Jeannine Boga   MOUTH SURGERY  03/2020   mass in left side at top- Dr.Owsley   TONSILLECTOMY     TUBAL LIGATION      Allergies  Allergen Reactions   Caffeine     "makes me very jittery"   Pravastatin Other (See Comments)    Muscle aches   E-Mycin [Erythromycin] Rash     Social History   Socioeconomic History   Marital status: Widowed    Spouse name: Not on file   Number of children: Not on file   Years of education: Not on file   Highest education level: Not on file  Occupational History   Not on file  Tobacco Use   Smoking status: Never    Passive exposure: Past   Smokeless tobacco: Never  Vaping Use   Vaping status: Never Used  Substance and Sexual Activity   Alcohol use: No   Drug use: No   Sexual activity: Never    Birth control/protection: Surgical  Other Topics Concern   Not on file  Social History Narrative   Caffeine NO.     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    Social Determinants of Health   Financial Resource Strain: Low Risk  (05/28/2021)   Overall Financial Resource Strain (CARDIA)    Difficulty of Paying Living Expenses: Not hard at all  Food Insecurity: No Food Insecurity (05/28/2021)   Hunger Vital Sign    Worried About Running Out of Food in the Last Year: Never true    Ran Out of Food in the Last Year: Never true  Transportation Needs: No Transportation Needs (05/28/2021)   PRAPARE - Transportation  Lack of Transportation (Medical): No    Lack of Transportation (Non-Medical): No  Physical Activity: Sufficiently Active (05/28/2021)   Exercise Vital Sign    Days of Exercise per Week: 3 days    Minutes of Exercise per Session: 60 min  Stress: No Stress Concern Present (05/28/2021)   [employer] of Occupational Health - Occupational Stress Questionnaire    Feeling of Stress : Not at all  Social Connections: Moderately Integrated (05/28/2021)   Social Connection and Isolation Panel [NHANES]    Frequency of Communication with Friends and Family: More than three times a week    Frequency of Social Gatherings with Friends and Family: More than three times a week    Attends Religious Services: More than 4 times per year    Active Member of [employer] or Organizations: Yes     Attends Banker Meetings: More than 4 times per year    Marital Status: Widowed  Intimate Partner Violence: Not At Risk (05/28/2021)   Humiliation, Afraid, Rape, and Kick questionnaire    Fear of Current or Ex-Partner: No    Emotionally Abused: No    Physically Abused: No    Sexually Abused: No    Family History  Problem Relation Age of Onset   Hypertension Mother    Tremor Mother    Thyroid disease Mother    Cancer Father    Liver cancer Father    Alzheimer's disease Father    Diabetes Paternal Aunt    Heart disease Paternal Aunt    Brain cancer Paternal Uncle    Parkinson's disease Neg Hx    Heart attack Neg Hx    Stroke Neg Hx      Review of Systems Constitutional: negative for anorexia, fevers and sweats  Eyes: negative for irritation, redness and visual disturbance  Ears, nose, mouth, throat, and face: negative for earaches, epistaxis, nasal congestion and sore throat  Respiratory: negative for cough, dyspnea on exertion, sputum and wheezing  Cardiovascular: negative for chest pain, dyspnea, lower extremity edema, orthopnea, palpitations and syncope  Gastrointestinal: negative for abdominal pain, constipation, diarrhea, melena, nausea and vomiting  Genitourinary:negative for dysuria, frequency and hematuria  Hematologic/lymphatic: negative for bleeding, easy bruising and lymphadenopathy  Musculoskeletal:negative for arthralgias, muscle weakness and stiff joints  Neurological: negative for coordination problems, gait problems, headaches and weakness  Endocrine: negative for diabetic symptoms including polydipsia, polyuria and weight loss     Objective:   Physical Exam  Gen. Pleasant, well-nourished, in no distress, normal affect ENT - no pallor,icterus, no post nasal drip Neck: No JVD, no thyromegaly, no carotid bruits Lungs: no use of accessory muscles, no dullness to percussion, clear without rales or rhonchi  Cardiovascular: Rhythm regular, heart  sounds  normal, no murmurs or gallops, no peripheral edema Abdomen: soft and non-tender, no hepatosplenomegaly, BS normal. Musculoskeletal: No deformities, no cyanosis or clubbing Neuro:  alert, non focal       Assessment & Plan:

## 2023-08-09 NOTE — Addendum Note (Signed)
Addended by: Lajoyce Lauber A on: 08/09/2023 04:53 PM   Modules accepted: Orders

## 2023-08-24 ENCOUNTER — Ambulatory Visit (HOSPITAL_BASED_OUTPATIENT_CLINIC_OR_DEPARTMENT_OTHER): Payer: Medicare Other

## 2023-08-24 DIAGNOSIS — I2723 Pulmonary hypertension due to lung diseases and hypoxia: Secondary | ICD-10-CM

## 2023-08-24 DIAGNOSIS — G4734 Idiopathic sleep related nonobstructive alveolar hypoventilation: Secondary | ICD-10-CM | POA: Diagnosis not present

## 2023-08-24 LAB — ECHOCARDIOGRAM COMPLETE
Area-P 1/2: 3.85 cm2
S' Lateral: 2.71 cm

## 2023-08-25 DIAGNOSIS — D229 Melanocytic nevi, unspecified: Secondary | ICD-10-CM | POA: Diagnosis not present

## 2023-08-25 DIAGNOSIS — L538 Other specified erythematous conditions: Secondary | ICD-10-CM | POA: Diagnosis not present

## 2023-08-25 DIAGNOSIS — Z08 Encounter for follow-up examination after completed treatment for malignant neoplasm: Secondary | ICD-10-CM | POA: Diagnosis not present

## 2023-08-25 DIAGNOSIS — R208 Other disturbances of skin sensation: Secondary | ICD-10-CM | POA: Diagnosis not present

## 2023-08-25 DIAGNOSIS — Z85828 Personal history of other malignant neoplasm of skin: Secondary | ICD-10-CM | POA: Diagnosis not present

## 2023-08-25 DIAGNOSIS — L2989 Other pruritus: Secondary | ICD-10-CM | POA: Diagnosis not present

## 2023-08-25 DIAGNOSIS — L814 Other melanin hyperpigmentation: Secondary | ICD-10-CM | POA: Diagnosis not present

## 2023-08-25 DIAGNOSIS — L570 Actinic keratosis: Secondary | ICD-10-CM | POA: Diagnosis not present

## 2023-08-25 DIAGNOSIS — L111 Transient acantholytic dermatosis [Grover]: Secondary | ICD-10-CM | POA: Diagnosis not present

## 2023-08-25 DIAGNOSIS — Z7189 Other specified counseling: Secondary | ICD-10-CM | POA: Diagnosis not present

## 2023-08-25 DIAGNOSIS — L820 Inflamed seborrheic keratosis: Secondary | ICD-10-CM | POA: Diagnosis not present

## 2023-09-07 DIAGNOSIS — Z7689 Persons encountering health services in other specified circumstances: Secondary | ICD-10-CM | POA: Diagnosis not present

## 2023-09-07 DIAGNOSIS — Z818 Family history of other mental and behavioral disorders: Secondary | ICD-10-CM | POA: Diagnosis not present

## 2023-09-07 DIAGNOSIS — E785 Hyperlipidemia, unspecified: Secondary | ICD-10-CM | POA: Diagnosis not present

## 2023-09-07 DIAGNOSIS — Z832 Family history of diseases of the blood and blood-forming organs and certain disorders involving the immune mechanism: Secondary | ICD-10-CM | POA: Diagnosis not present

## 2023-09-22 DIAGNOSIS — R0902 Hypoxemia: Secondary | ICD-10-CM | POA: Diagnosis not present

## 2023-09-22 DIAGNOSIS — G4730 Sleep apnea, unspecified: Secondary | ICD-10-CM | POA: Diagnosis not present

## 2023-10-11 ENCOUNTER — Telehealth: Payer: Medicare Other | Admitting: Family Medicine

## 2023-10-11 DIAGNOSIS — J111 Influenza due to unidentified influenza virus with other respiratory manifestations: Secondary | ICD-10-CM

## 2023-10-11 MED ORDER — OSELTAMIVIR PHOSPHATE 75 MG PO CAPS: 75.0000 mg | ORAL_CAPSULE | Freq: Two times a day (BID) | ORAL | 0 refills | Status: AC

## 2023-10-11 NOTE — Progress Notes (Signed)

## 2023-10-26 ENCOUNTER — Other Ambulatory Visit (HOSPITAL_COMMUNITY): Payer: Self-pay | Admitting: Nurse Practitioner

## 2023-10-26 DIAGNOSIS — E785 Hyperlipidemia, unspecified: Secondary | ICD-10-CM

## 2023-10-29 LAB — COLOGUARD: COLOGUARD: NEGATIVE

## 2023-10-29 LAB — EXTERNAL GENERIC LAB PROCEDURE: COLOGUARD: NEGATIVE

## 2023-11-02 ENCOUNTER — Other Ambulatory Visit: Payer: Self-pay | Admitting: Nurse Practitioner

## 2023-11-02 DIAGNOSIS — E785 Hyperlipidemia, unspecified: Secondary | ICD-10-CM

## 2023-11-16 ENCOUNTER — Other Ambulatory Visit: Payer: Medicare Other

## 2023-11-22 ENCOUNTER — Ambulatory Visit
Admission: RE | Admit: 2023-11-22 | Discharge: 2023-11-22 | Disposition: A | Discharge status: Home / Self Care | Payer: No Typology Code available for payment source | Source: Ambulatory Visit | Attending: Nurse Practitioner | Admitting: Nurse Practitioner

## 2023-11-22 DIAGNOSIS — E785 Hyperlipidemia, unspecified: Secondary | ICD-10-CM

## 2023-12-06 ENCOUNTER — Ambulatory Visit: Payer: Medicare Other | Admitting: Family Medicine

## 2023-12-07 ENCOUNTER — Other Ambulatory Visit: Payer: Self-pay

## 2023-12-07 DIAGNOSIS — D751 Secondary polycythemia: Secondary | ICD-10-CM

## 2023-12-08 ENCOUNTER — Inpatient Hospital Stay: Payer: Medicare Other | Attending: Hematology

## 2023-12-08 DIAGNOSIS — D751 Secondary polycythemia: Secondary | ICD-10-CM | POA: Insufficient documentation

## 2023-12-08 DIAGNOSIS — G4734 Idiopathic sleep related nonobstructive alveolar hypoventilation: Secondary | ICD-10-CM | POA: Diagnosis not present

## 2023-12-08 LAB — CBC WITH DIFFERENTIAL/PLATELET
Abs Immature Granulocytes: 0.01 10*3/uL (ref 0.00–0.07)
Basophils Absolute: 0 10*3/uL (ref 0.0–0.1)
Basophils Relative: 0 %
Eosinophils Absolute: 0 10*3/uL (ref 0.0–0.5)
Eosinophils Relative: 1 %
HCT: 50.5 % — ABNORMAL HIGH (ref 36.0–46.0)
Hemoglobin: 15.7 g/dL — ABNORMAL HIGH (ref 12.0–15.0)
Immature Granulocytes: 0 %
Lymphocytes Relative: 41 %
Lymphs Abs: 1.8 10*3/uL (ref 0.7–4.0)
MCH: 29.6 pg (ref 26.0–34.0)
MCHC: 31.1 g/dL (ref 30.0–36.0)
MCV: 95.1 fL (ref 80.0–100.0)
Monocytes Absolute: 0.3 10*3/uL (ref 0.1–1.0)
Monocytes Relative: 6 %
Neutro Abs: 2.3 10*3/uL (ref 1.7–7.7)
Neutrophils Relative %: 52 %
Platelets: 207 10*3/uL (ref 150–400)
RBC: 5.31 MIL/uL — ABNORMAL HIGH (ref 3.87–5.11)
RDW: 12.2 % (ref 11.5–15.5)
WBC: 4.4 10*3/uL (ref 4.0–10.5)
nRBC: 0 % (ref 0.0–0.2)

## 2023-12-15 ENCOUNTER — Inpatient Hospital Stay (HOSPITAL_BASED_OUTPATIENT_CLINIC_OR_DEPARTMENT_OTHER): Payer: Medicare Other | Admitting: Oncology

## 2023-12-15 DIAGNOSIS — D751 Secondary polycythemia: Secondary | ICD-10-CM | POA: Diagnosis not present

## 2023-12-15 NOTE — Progress Notes (Signed)
 Virtual Visit via Telephone Note  I connected with Rachel [name] on 12/15/23 at 10:30 AM EDT by telephone and verified that I am speaking with the correct person using two identifiers.  Location: Patient: Home Provider: Clinic    I discussed the limitations, risks, security and privacy concerns of performing an evaluation and management service by telephone and the availability of in person appointments. I also discussed with the patient that there may be a patient responsible charge related to this service. The patient expressed understanding and agreed to proceed.   History of Present Illness: Rachel [name] is a 73 year old female who was last seen in clinic by me on 06/16/2023.  She is here for follow-up for secondary polycythemia setting of nocturnal hypoxia.  In the interim, she denies any hospitalizations, surgeries or changes to her baseline health.  She met with Dr. Andrey Cota for sleep study which showed nocturnal hypoxemia and dysfunction associated with sleep stages or arousal.  It was recommended she follow-up with pulmonology for underlying pulmonary disorder.  It did not recommend CPAP or AutoPap.  She met with pulmonology Dr. Vassie Loll on 08/04/2023 for nocturnal hypoxia and was recommended to have an echocardiogram which showed an EF of 70%, chest x-ray which was also normal and to wear a pulse oximeter at nighttime.  She has not had a follow-up with pulmonology yet.  She is trying to schedule an appointment at this time.  Denies any recent hospitalizations, surgeries or changes to her baseline health.  Appetite is 100% energy levels are 70%.  She denies any pain.  Overall reports doing well.  Reports her mom's been sick since January 2025 and she has been dealing with that.  She got a new sleep number bed in December which has dramatically changed her sleeping habits.  She is sleeping through the night and feels more rested in the morning.  She did have CT cardiac scoring on 11/22/2023 and  was placed on Zetia.  She is unable to tolerate statins.  She is also on 81 mg aspirin.  Had cologuard on 10/17/23 which was negative.    Observations/Objective:Review of Systems  Constitutional: Negative.   Psychiatric/Behavioral:  The patient has insomnia (Improvment since switching her bed).     Physical Exam Vitals reviewed.  Neurological:     Mental Status: She is alert and oriented to person, place, and time.      Assessment and Plan: 1. Polycythemia, secondary (Primary) -Secondary to nocturnal hypoxia.  -Had sleep study on 05/16/23 which showed nocturnal hypoxemia and dysfunction associated with sleep stages or arousal.  It was recommended she follow-up with pulmonology for underlying pulmonary disorder.  It did not recommend CPAP or AutoPap.   -She met with pulmonology Dr. Vassie Loll on 08/04/2023 for nocturnal hypoxia and was recommended to have an echocardiogram which showed an EF of 70%, chest x-ray which was also normal and to wear a pulse oximeter at nighttime.  She has not had follow-up -Labs from 12/08/2023 showed stable but persistently elevated hemoglobin at 15.7 and hematocrit 50.5.  Otherwise differential was normal. -She feels like her sleep has significantly improved since she has switched her mattress to a sleep by number.  -We discussed should her hemoglobin and hematocrit continue to trend up and/or she develops symptoms such as headache, dizziness or changes in her vision we could potentially do phlebotomy or she could donate blood on a quarterly basis.  We discussed waiting until her next lab draw to see if her sleeping habits make  any difference in her lab work.  Follow Up Instructions: Return to clinic in 6 months with labs a few days before.   I discussed the assessment and treatment plan with the patient. The patient was provided an opportunity to ask questions and all were answered. The patient agreed with the plan and demonstrated an understanding of the  instructions.   The patient was advised to call back or seek an in-person evaluation if the symptoms worsen or if the condition fails to improve as anticipated.  I provided 25 minutes of non-face-to-face time during this encounter.   Mauro Kaufmann, NP

## 2024-02-02 ENCOUNTER — Encounter (HOSPITAL_BASED_OUTPATIENT_CLINIC_OR_DEPARTMENT_OTHER): Payer: Self-pay

## 2024-02-02 ENCOUNTER — Encounter (HOSPITAL_BASED_OUTPATIENT_CLINIC_OR_DEPARTMENT_OTHER): Payer: Self-pay | Admitting: Pulmonary Disease

## 2024-02-10 NOTE — Progress Notes (Unsigned)
 @Patient  ID: Rachel [name], female    DOB: 11-May-1951, 73 y.o.   MRN: [id number]  No chief complaint on file.   Referring provider: Omie Bickers, MD  HPI: 73 year old female, never smoker followed for nocturnal hypoxia.  She is a patient of Dr. Hortense Lyons and last seen in office 08/04/2023.  TEST/EVENTS:  05/2023 NPSG: AHI 4.3/h, SpO2 low 75%; spent 75 minutes <88% 08/04/2023 CXR: No acute cardiopulmonary process 08/24/2023 echo: EF 65 to 70%.  G1 DD.  Otherwise and function normal.  Unable to measure PASP.  No significant valvular disease.  Mild dilatation of ascending aorta, 40 mm 11/22/2023 cardiac CT: Visualized lung fields unremarkable  08/04/2023: OV with Dr. Villa Greaser for consult.  Referred for nocturnal hypoxia and polycythemia.  She has had elevated hemoglobin levels ever since 2016.  Does not sleep well so was referred to neurologist and nocturnal sleep study was performed without any evidence of sleep apnea but showed nocturnal hypoxia with desaturation less than 88% about 25% of total sleep time.  Reports waking up at 5 AM her whole life.  Denies excessive daytime somnolence.  Epworth score 4.  Bedtime between 10 PM and midnight.  Sleep latency about 30 minutes.  Occasionally will wake up at 2:30 AM and is not able to fall back asleep. Etiology of nocturnal hypoxemia is unclear.  Will do pulmonary workup including a chest x-ray and echocardiogram to rule out pulmonary hypertension.  Plan to do ONO on room air and if hypoxia significant, plan to start nocturnal oxygen.  Follow symptoms and recheck hemoglobin in 2 to 3 months of consistent oxygen use  Allergies  Allergen Reactions   Caffeine     "makes me very jittery"   Pravastatin Other (See Comments)    Muscle aches   E-Mycin [Erythromycin] Rash     There is no immunization history on file for this patient.  Past Medical History:  Diagnosis Date   High cholesterol     Tobacco History: Social History   Tobacco Use   Smoking Status Never   Passive exposure: Past  Smokeless Tobacco Never   Counseling given: Not Answered   Outpatient Medications Prior to Visit  Medication Sig Dispense Refill   cyanocobalamin  (VITAMIN B12) 1000 MCG tablet Take 1,000 mcg by mouth daily.     ezetimibe (ZETIA) 10 MG tablet Take 1 tablet by mouth daily.     IBUPROFEN PO Take 1 tablet by mouth as needed.     Multiple Vitamins-Minerals (MULTIVITAMIN ADULTS PO) Take 2 tablets by mouth daily. MVI with omega 3 Natures Made     Probiotic Product (PROBIOTIC DAILY PO) Take by mouth.     No facility-administered medications prior to visit.     Review of Systems:   Constitutional: No weight loss or gain, night sweats, fevers, chills, fatigue, or lassitude. HEENT: No headaches, difficulty swallowing, tooth/dental problems, or sore throat. No sneezing, itching, ear ache, nasal congestion, or post nasal drip CV:  No chest pain, orthopnea, PND, swelling in lower extremities, anasarca, dizziness, palpitations, syncope Resp: No shortness of breath with exertion or at rest. No excess mucus or change in color of mucus. No productive or non-productive. No hemoptysis. No wheezing.  No chest wall deformity GI:  No heartburn, indigestion, abdominal pain, nausea, vomiting, diarrhea, change in bowel habits, loss of appetite, bloody stools.  GU: No dysuria, change in color of urine, urgency or frequency.  No flank pain, no hematuria  Skin: No rash, lesions, ulcerations MSK:  No joint pain or swelling.  No decreased range of motion.  No back pain. Neuro: No dizziness or lightheadedness.  Psych: No depression or anxiety. Mood stable.     Physical Exam:  There were no vitals taken for this visit.  GEN: Pleasant, interactive, well-nourished/chronically-ill appearing/acutely-ill appearing/poorly-nourished/morbidly obese; in no acute distress.****** HEENT:  Normocephalic and atraumatic. EACs patent bilaterally. TM pearly gray with present  light reflex bilaterally. PERRLA. Sclera white. Nasal turbinates pink, moist and patent bilaterally. No rhinorrhea present. Oropharynx pink and moist, without exudate or edema. No lesions, ulcerations, or postnasal drip.  NECK:  Supple w/ fair ROM. No JVD present. Normal carotid impulses w/o bruits. Thyroid  symmetrical with no goiter or nodules palpated. No lymphadenopathy.   CV: RRR, no m/r/g, no peripheral edema. Pulses intact, +2 bilaterally. No cyanosis, pallor or clubbing. PULMONARY:  Unlabored, regular breathing. Clear bilaterally A&P w/o wheezes/rales/rhonchi. No accessory muscle use.  GI: BS present and normoactive. Soft, non-tender to palpation. No organomegaly or masses detected. No CVA tenderness. MSK: No erythema, warmth or tenderness. Cap refil <2 sec all extrem. No deformities or joint swelling noted.  Neuro: A/Ox3. No focal deficits noted.   Skin: Warm, no lesions or rashe Psych: Normal affect and behavior. Judgement and thought content appropriate.     Lab Results:  CBC    Component Value Date/Time   WBC 4.4 12/08/2023 1012   RBC 5.31 (H) 12/08/2023 1012   HGB 15.7 (H) 12/08/2023 1012   HCT 50.5 (H) 12/08/2023 1012   PLT 207 12/08/2023 1012   MCV 95.1 12/08/2023 1012   MCH 29.6 12/08/2023 1012   MCHC 31.1 12/08/2023 1012   RDW 12.2 12/08/2023 1012   LYMPHSABS 1.8 12/08/2023 1012   MONOABS 0.3 12/08/2023 1012   EOSABS 0.0 12/08/2023 1012   BASOSABS 0.0 12/08/2023 1012    BMET    Component Value Date/Time   NA 141 04/14/2021 0856   K 4.8 04/14/2021 0856   CL 104 04/14/2021 0856   CO2 27 04/14/2021 0856   GLUCOSE 92 04/14/2021 0856   BUN 15 04/14/2021 0856   CREATININE 0.68 04/14/2021 0856   CALCIUM 9.4 04/14/2021 0856   GFRNONAA 89 (L) 01/13/2015 0900   GFRAA >90 01/13/2015 0900    BNP No results found for: "BNP"   Imaging:  No results found.  Administration History     None           No data to display          No results found for:  "NITRICOXIDE"      Assessment & Plan:   No problem-specific Assessment & Plan notes found for this encounter.   Advised if symptoms do not improve or worsen, to please contact office for sooner follow up or seek emergency care.   I spent *** minutes of dedicated to the care of this patient on the date of this encounter to include pre-visit review of records, face-to-face time with the patient discussing conditions above, post visit ordering of testing, clinical documentation with the electronic health record, making appropriate referrals as documented, and communicating necessary findings to members of the patients care team.  Roetta Clarke, NP 02/10/2024  Pt aware and understands NP's role.

## 2024-02-13 ENCOUNTER — Encounter: Payer: Self-pay | Admitting: Nurse Practitioner

## 2024-02-13 ENCOUNTER — Ambulatory Visit (INDEPENDENT_AMBULATORY_CARE_PROVIDER_SITE_OTHER): Admitting: Nurse Practitioner

## 2024-02-13 VITALS — BP 136/86 | HR 84 | Temp 97.6°F | Ht 62.0 in | Wt 145.8 lb

## 2024-02-13 DIAGNOSIS — R0902 Hypoxemia: Secondary | ICD-10-CM | POA: Diagnosis not present

## 2024-02-13 DIAGNOSIS — G4734 Idiopathic sleep related nonobstructive alveolar hypoventilation: Secondary | ICD-10-CM

## 2024-02-13 DIAGNOSIS — J849 Interstitial pulmonary disease, unspecified: Secondary | ICD-10-CM

## 2024-02-13 NOTE — Patient Instructions (Signed)
 Waiting on your overnight oxygen study results After this, we can decide if we need to start oxygen and obtain further workup  I'm glad you are feeling better  We will decide on follow up once I have the results back

## 2024-02-14 NOTE — Progress Notes (Unsigned)
 Addendum: ONO reviewed from 09/2023 with >2 hr </88%, SpO2 low 80% and baseline 89%. Unclear etiology. Discussed with pt. Will start her on supplemental oxygen 2 lpm nightly. Orders placed today. Plan to move forward with PFT and follow up in 4-6 weeks afterwards to review. May consider additional testing following this, pending results.

## 2024-02-15 ENCOUNTER — Telehealth: Payer: Self-pay

## 2024-02-15 NOTE — Telephone Encounter (Signed)
 Patient advised that ONO reports completed in December is no longer valid to order oxygen. Patient requested to hold off on ordering ONO. She reports she has a lot going on this month. She will call back to place order. NFN.

## 2024-02-16 ENCOUNTER — Encounter: Payer: Self-pay | Admitting: Nurse Practitioner

## 2024-02-16 NOTE — Assessment & Plan Note (Signed)
 Unclear etiology. Awaiting ONO results. Overall, sleep has improved. If she continues to have nocturnal hypoxia, would recommend PFT and consider CT chest to evaluate for underlying pulmonary etiology. Prior echo and CXR unremarkable. No evidence of sleep apnea. BMI 26 and no evidence of hypoventilation on prior study. Sleep overall has improved.   Patient Instructions  Waiting on your overnight oxygen study results After this, we can decide if we need to start oxygen and obtain further workup  I'm glad you are feeling better  We will decide on follow up once I have the results back

## 2024-02-20 DIAGNOSIS — G4734 Idiopathic sleep related nonobstructive alveolar hypoventilation: Secondary | ICD-10-CM

## 2024-02-20 NOTE — Telephone Encounter (Signed)
 The ONO is the overnight study at her home that they mail to her. I'd encourage her to repeat so we can get her oxygen. Thanks!

## 2024-02-20 NOTE — Telephone Encounter (Signed)
 NA. LMTCB. Mychart message sent.

## 2024-02-21 ENCOUNTER — Other Ambulatory Visit: Payer: Self-pay | Admitting: Nurse Practitioner

## 2024-02-21 DIAGNOSIS — Z1231 Encounter for screening mammogram for malignant neoplasm of breast: Secondary | ICD-10-CM

## 2024-02-21 NOTE — Telephone Encounter (Signed)
 Please see mychart message. NFN.

## 2024-02-29 ENCOUNTER — Encounter (HOSPITAL_COMMUNITY)
Admission: RE | Admit: 2024-02-29 | Discharge: 2024-02-29 | Disposition: A | Discharge status: Home / Self Care | Source: Ambulatory Visit | Attending: Ophthalmology | Admitting: Ophthalmology

## 2024-02-29 ENCOUNTER — Encounter (HOSPITAL_COMMUNITY): Payer: Self-pay

## 2024-02-29 HISTORY — DX: Other abnormality of red blood cells: R71.8

## 2024-02-29 HISTORY — DX: Anxiety disorder, unspecified: F41.9

## 2024-02-29 HISTORY — DX: Other specified postprocedural states: Z98.890

## 2024-02-29 HISTORY — DX: Other specified postprocedural states: R11.2

## 2024-02-29 NOTE — H&P (Signed)
 Surgical History & Physical  Patient Name: Rachel [name]  DOB: 01/19/1951  Surgery: Cataract extraction with intraocular lens implant phacoemulsification; Left Eye Surgeon: [name] MD Surgery Date: 03/02/2024 Pre-Op Date: 01/09/2024  HPI: A [name] old female patient  1.The patient is here for cataract eval Ref: Dr. Barbra Boone. Pt. complains of difficulty when seeing street signs. Both eyes are affected. The episode is constant. Symptoms occur when the patient is driving. Distance vision is worse than near. The complaint is associated with blurry vision. This is negatively affecting the patients quality of life and the patient is unable to function adequately in life with the current level of vision. Pt. is using Systane complete for dry eyes as needed.  HPI Completed by Dr. [name]  Medical History: Cataracts OU: Chorioretinal scar, OU: Dermatochalasis  Cancer Cholesterol  Review of Systems Negative Allergic/Immunologic Negative Cardiovascular Negative Constitutional Negative Ear, Nose, Mouth & Throat Negative Endocrine Negative Eyes Negative Gastrointestinal Negative Genitourinary Negative Hemotologic/Lymphatic Negative Integumentary Negative Musculoskeletal Negative Neurological Negative Psychiatry Negative Respiratory  Social Never smoked   Medication Systane Complete,  Zetia, Omega 3-dha-epa-fish oil, Bayer Chewable Aspirin  Sx/Procedures Tonsilectomy, Hysterectomy, Gallbladder Removal, Basal Cell Removal, Oral Surgery  Drug Allergies  Caffeine, Erythromycin  History & Physical: Heent: cataracts NECK: supple without bruits LUNGS: lungs clear to auscultation CV: regular rate and rhythm Abdomen: soft and non-tender  Impression & Plan: Assessment: 1.  NUCLEAR SCLEROSIS AGE RELATED; Both Eyes (H25.13) 2.  BLEPHARITIS; Right Upper Lid, Right Lower Lid, Left Upper Lid, Left Lower Lid (H01.001, H01.002,H01.004,H01.005) 3.  DERMATOCHALASIS, no surgery;  Right Upper Lid, Left Upper Lid (H02.831, H02.834) 4.  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  ASTIGMATISM, REGULAR; Both Eyes (H52.223)  Plan: 1.  Cataract accounts for the patient's decreased vision. This visual impairment is not correctable with a tolerable change in glasses or contact lenses. Cataract surgery with an implantation of a new lens should significantly improve the visual and functional status of the patient. Discussed all risks, benefits, alternatives, and potential complications. Discussed the procedures and recovery. Patient desires to have surgery. A-scan ordered and performed today for intra-ocular lens calculations. The surgery will be performed in order to improve vision for driving, reading, and for eye examinations. Recommend phacoemulsification with intra-ocular lens. Recommend Dextenza  for post-operative pain and inflammation. Left Eye non-dominant - first.. Dilates poorly - shugarcaine by protocol. Malyugin Ring. Omidira.  2.  Blepharitis is present - recommend regular lid cleaning.  3.  Asymptomatic, recommend observation for now. Findings, prognosis and treatment options reviewed.  4.  Findings, prognosis and treatment options reviewed. Despite presence of pucker patient would like to observe for now.  5.  Worse right eye. Consider toric IOL OD only.

## 2024-03-02 ENCOUNTER — Encounter (HOSPITAL_COMMUNITY): Payer: Self-pay | Admitting: Ophthalmology

## 2024-03-02 ENCOUNTER — Encounter (HOSPITAL_COMMUNITY)
Admission: RE | Disposition: A | Discharge status: Home / Self Care | Payer: Self-pay | Source: Home / Self Care | Attending: Ophthalmology

## 2024-03-02 ENCOUNTER — Ambulatory Visit (HOSPITAL_COMMUNITY): Admitting: Anesthesiology

## 2024-03-02 ENCOUNTER — Ambulatory Visit (HOSPITAL_COMMUNITY)
Admission: RE | Admit: 2024-03-02 | Discharge: 2024-03-02 | Disposition: A | Discharge status: Home / Self Care | Attending: Ophthalmology | Admitting: Ophthalmology

## 2024-03-02 ENCOUNTER — Other Ambulatory Visit: Payer: Self-pay

## 2024-03-02 DIAGNOSIS — H2512 Age-related nuclear cataract, left eye: Secondary | ICD-10-CM

## 2024-03-02 DIAGNOSIS — H0100A Unspecified blepharitis right eye, upper and lower eyelids: Secondary | ICD-10-CM | POA: Insufficient documentation

## 2024-03-02 DIAGNOSIS — H5712 Ocular pain, left eye: Secondary | ICD-10-CM | POA: Insufficient documentation

## 2024-03-02 DIAGNOSIS — H02834 Dermatochalasis of left upper eyelid: Secondary | ICD-10-CM | POA: Insufficient documentation

## 2024-03-02 DIAGNOSIS — I10 Essential (primary) hypertension: Secondary | ICD-10-CM | POA: Insufficient documentation

## 2024-03-02 DIAGNOSIS — H0100B Unspecified blepharitis left eye, upper and lower eyelids: Secondary | ICD-10-CM | POA: Insufficient documentation

## 2024-03-02 DIAGNOSIS — Z79899 Other long term (current) drug therapy: Secondary | ICD-10-CM | POA: Insufficient documentation

## 2024-03-02 DIAGNOSIS — H35373 Puckering of macula, bilateral: Secondary | ICD-10-CM | POA: Insufficient documentation

## 2024-03-02 DIAGNOSIS — H02831 Dermatochalasis of right upper eyelid: Secondary | ICD-10-CM | POA: Insufficient documentation

## 2024-03-02 DIAGNOSIS — H52223 Regular astigmatism, bilateral: Secondary | ICD-10-CM | POA: Diagnosis not present

## 2024-03-02 HISTORY — PX: CATARACT EXTRACTION W/PHACO: SHX586

## 2024-03-02 HISTORY — PX: INSERTION, STENT, DRUG-ELUTING, LACRIMAL CANALICULUS: SHX7453

## 2024-03-02 SURGERY — PHACOEMULSIFICATION, CATARACT, WITH IOL INSERTION
Anesthesia: Monitor Anesthesia Care | Site: Eye | Laterality: Left

## 2024-03-02 MED ORDER — MOXIFLOXACIN HCL 5 MG/ML IO SOLN
INTRAOCULAR | Status: DC | PRN
  Administered 2024-03-02: .2 mL via INTRACAMERAL

## 2024-03-02 MED ORDER — SODIUM HYALURONATE 10 MG/ML IO SOLUTION
PREFILLED_SYRINGE | INTRAOCULAR | Status: DC | PRN
Start: 2024-03-02 — End: 2024-03-02
  Administered 2024-03-02: .85 mL via INTRAOCULAR

## 2024-03-02 MED ORDER — MIDAZOLAM HCL 5 MG/5ML IJ SOLN
INTRAMUSCULAR | Status: DC | PRN
  Administered 2024-03-02: 1 mg via INTRAVENOUS

## 2024-03-02 MED ORDER — EPINEPHRINE PF 1 MG/ML IJ SOLN
INTRAOCULAR | Status: DC | PRN
  Administered 2024-03-02: 1 mL via OPHTHALMIC

## 2024-03-02 MED ORDER — EPINEPHRINE PF 1 MG/ML IJ SOLN
INTRAOCULAR | Status: DC | PRN
  Administered 2024-03-02: 500 mL

## 2024-03-02 MED ORDER — DEXAMETHASONE 0.4 MG OP INST
VAGINAL_INSERT | OPHTHALMIC | Status: DC | PRN
  Administered 2024-03-02: .4 mg via OPHTHALMIC

## 2024-03-02 MED ORDER — POVIDONE-IODINE 5 % OP SOLN
OPHTHALMIC | Status: DC | PRN
Start: 2024-03-02 — End: 2024-03-02
  Administered 2024-03-02: 1 via OPHTHALMIC

## 2024-03-02 MED ORDER — TROPICAMIDE 1 % OP SOLN
1.0000 [drp] | OPHTHALMIC | Status: AC | PRN
  Administered 2024-03-02 (×3): 1 [drp] via OPHTHALMIC

## 2024-03-02 MED ORDER — STERILE WATER FOR IRRIGATION IR SOLN
Status: DC | PRN
  Administered 2024-03-02: 250 mL

## 2024-03-02 MED ORDER — TETRACAINE HCL 0.5 % OP SOLN
1.0000 [drp] | OPHTHALMIC | Status: AC | PRN
  Administered 2024-03-02 (×3): 1 [drp] via OPHTHALMIC

## 2024-03-02 MED ORDER — PHENYLEPHRINE HCL 2.5 % OP SOLN
1.0000 [drp] | OPHTHALMIC | Status: AC | PRN
Start: 2024-03-02 — End: 2024-03-02
  Administered 2024-03-02 (×3): 1 [drp] via OPHTHALMIC

## 2024-03-02 MED ORDER — MIDAZOLAM HCL 2 MG/2ML IJ SOLN
INTRAMUSCULAR | Status: AC
  Filled 2024-03-02: qty 2

## 2024-03-02 MED ORDER — LACTATED RINGERS IV SOLN: INTRAVENOUS | Status: DC

## 2024-03-02 MED ORDER — BSS IO SOLN
INTRAOCULAR | Status: DC | PRN
Start: 2024-03-02 — End: 2024-03-02
  Administered 2024-03-02: 15 mL via INTRAOCULAR

## 2024-03-02 MED ORDER — SODIUM HYALURONATE 23MG/ML IO SOSY
PREFILLED_SYRINGE | INTRAOCULAR | Status: DC | PRN
  Administered 2024-03-02: .6 mL via INTRAOCULAR

## 2024-03-02 MED ORDER — LIDOCAINE HCL 3.5 % OP GEL
1.0000 | Freq: Once | OPHTHALMIC | Status: AC
  Administered 2024-03-02: 1 via OPHTHALMIC

## 2024-03-02 SURGICAL SUPPLY — 12 items
CATARACT SUITE SIGHTPATH (MISCELLANEOUS) ×1 IMPLANT
CLOTH BEACON ORANGE TIMEOUT ST (SAFETY) ×1 IMPLANT
EYE SHIELD UNIVERSAL CLEAR (GAUZE/BANDAGES/DRESSINGS) IMPLANT
FEE CATARACT SUITE SIGHTPATH (MISCELLANEOUS) ×1 IMPLANT
GLOVE BIOGEL PI IND STRL 7.0 (GLOVE) ×2 IMPLANT
LENS IOL TECNIS EYHANCE 12.5 (Intraocular Lens) IMPLANT
NDL HYPO 18GX1.5 BLUNT FILL (NEEDLE) ×1 IMPLANT
NEEDLE HYPO 18GX1.5 BLUNT FILL (NEEDLE) ×1 IMPLANT
PAD ARMBOARD POSITIONER FOAM (MISCELLANEOUS) ×1 IMPLANT
SYR TB 1ML LL NO SAFETY (SYRINGE) ×1 IMPLANT
TAPE SURG TRANSPORE 1 IN (GAUZE/BANDAGES/DRESSINGS) IMPLANT
WATER STERILE IRR 250ML POUR (IV SOLUTION) ×1 IMPLANT

## 2024-03-02 NOTE — Interval H&P Note (Signed)
 History and Physical Interval Note:  03/02/2024 11:40 AM  Rachel [name]  has presented today for surgery, with the diagnosis of nuclear sclerotic cataract, left eye.  The various methods of treatment have been discussed with the patient and family. After consideration of risks, benefits and other options for treatment, the patient has consented to  Procedure(s) with comments: PHACOEMULSIFICATION, CATARACT, WITH IOL INSERTION (Left) - CDE: INSERTION, STENT, DRUG-ELUTING, LACRIMAL CANALICULUS (Left) as a surgical intervention.  The patient's history has been reviewed, patient examined, no change in status, stable for surgery.  I have reviewed the patient's chart and labs.  Questions were answered to the patient's satisfaction.     [name]

## 2024-03-02 NOTE — Transfer of Care (Signed)
 Immediate Anesthesia Transfer of Care Note  Patient: Rachel [name]  Procedure(s) Performed: PHACOEMULSIFICATION, CATARACT, WITH IOL INSERTION (Left: Eye) INSERTION, STENT, DRUG-ELUTING, LACRIMAL CANALICULUS (Left: Eye)  Patient Location: PACU  Anesthesia Type:MAC  Level of Consciousness: awake and alert   Airway & Oxygen Therapy: Patient Spontanous Breathing and Patient connected to nasal cannula oxygen  Post-op Assessment: Report given to RN and Post -op Vital signs reviewed and stable  Post vital signs: Reviewed and stable  Last Vitals:  Vitals Value Taken Time  BP    Temp    Pulse    Resp    SpO2      Last Pain:  Vitals:   03/02/24 1057  TempSrc: Oral  PainSc: 0-No pain      Patients Stated Pain Goal: 4 (03/02/24 1057)  Complications: No notable events documented.

## 2024-03-02 NOTE — Op Note (Signed)
 Date of procedure: 03/02/24  Pre-operative diagnosis:  Visually significant age-related nuclear cataract, Left Eye (H25.12)  Post-operative diagnosis:   1. Visually significant age-related nuclear cataract, Left Eye (H25.12) 2. Pain and inflammation following cataract surgery Left Eye (H57.12)  Procedure:  Removal of cataract via phacoemulsification and insertion of intra-ocular lens Johnson and Johnson DIB00 +12.5D into the capsular bag of the Left Eye 2. Placement of Dextenza  insert, Left Eye  Attending surgeon: Pleas Brill. Wania Longstreth, MD, MA  Anesthesia: MAC, Topical Akten  Complications: None  Estimated Blood Loss: <60mL (minimal)  Specimens: None  Implants: As above  Indications:  Visually significant age-related cataract, Left Eye  Procedure:  The patient was seen and identified in the pre-operative area. The operative eye was identified and dilated.  The operative eye was marked.  Topical anesthesia was administered to the operative eye.     The patient was then to the operative suite and placed in the supine position.  A timeout was performed confirming the patient, procedure to be performed, and all other relevant information.   The patient's face was prepped and draped in the usual fashion for intra-ocular surgery.  A lid speculum was placed into the operative eye and the surgical microscope moved into place and focused.  An inferotemporal paracentesis was created using a 20 gauge paracentesis blade.  Shugarcaine was injected into the anterior chamber.  Viscoelastic was injected into the anterior chamber.  A temporal clear-corneal main wound incision was created using a 2.28mm microkeratome.  A continuous curvilinear capsulorrhexis was initiated using an irrigating cystitome and completed using capsulorrhexis forceps.  Hydrodissection and hydrodeliniation were performed.  Viscoelastic was injected into the anterior chamber.  A phacoemulsification handpiece and a chopper as a second  instrument were used to remove the nucleus and epinucleus. The irrigation/aspiration handpiece was used to remove any remaining cortical material.   The capsular bag was reinflated with viscoelastic, checked, and found to be intact.  The intraocular lens was inserted into the capsular bag.  The irrigation/aspiration handpiece was used to remove any remaining viscoelastic.  The clear corneal wound and paracentesis wounds were then hydrated and checked with Weck-Cels to be watertight. 0.1mL of moxifloxacin was injected into the anterior chamber.  The lid-speculum was removed. The lower punctum was dilated. A Dextenza  implant was placed in the lower canaliculus without complication.  The drape was removed.  The patient's face was cleaned with a wet and dry 4x4. A clear shield was taped over the eye. The patient was taken to the post-operative care unit in good condition, having tolerated the procedure well.  Post-Op Instructions: The patient will follow up at [facility] for a same day post-operative evaluation and will receive all other orders and instructions.

## 2024-03-02 NOTE — Anesthesia Preprocedure Evaluation (Signed)
Anesthesia Evaluation  Patient identified by MRN, date of birth, ID band Patient awake    Reviewed: Allergy & Precautions, H&P , NPO status , Patient's Chart, lab work & pertinent test results, reviewed documented beta blocker date and time   Airway Mallampati: II  TM Distance: >3 FB Neck ROM: full    Dental no notable dental hx.    Pulmonary neg pulmonary ROS   Pulmonary exam normal breath sounds clear to auscultation       Cardiovascular Exercise Tolerance: Good hypertension, negative cardio ROS  Rhythm:regular Rate:Normal     Neuro/Psych negative neurological ROS  negative psych ROS   GI/Hepatic negative GI ROS, Neg liver ROS,,,  Endo/Other  negative endocrine ROS    Renal/GU negative Renal ROS  negative genitourinary   Musculoskeletal   Abdominal   Peds  Hematology negative hematology ROS (+)   Anesthesia Other Findings   Reproductive/Obstetrics negative OB ROS                             Anesthesia Physical Anesthesia Plan  ASA: 2  Anesthesia Plan: MAC   Post-op Pain Management:    Induction:   PONV Risk Score and Plan:   Airway Management Planned:   Additional Equipment:   Intra-op Plan:   Post-operative Plan:   Informed Consent: I have reviewed the patients History and Physical, chart, labs and discussed the procedure including the risks, benefits and alternatives for the proposed anesthesia with the patient or authorized representative who has indicated his/her understanding and acceptance.     Dental Advisory Given  Plan Discussed with: CRNA  Anesthesia Plan Comments:        Anesthesia Quick Evaluation

## 2024-03-02 NOTE — Discharge Instructions (Addendum)
 Please discharge patient when stable, will follow up today with Dr. June Leap at the [facility] office immediately following discharge.  Leave shield in place until visit.  All paperwork with discharge instructions will be given at the office.  [facility] Address:  [address]  [place], [place] [address]

## 2024-03-04 NOTE — Anesthesia Postprocedure Evaluation (Signed)
 Anesthesia Post Note  Patient: Rachel [name]  Procedure(s) Performed: PHACOEMULSIFICATION, CATARACT, WITH IOL INSERTION (Left: Eye) INSERTION, STENT, DRUG-ELUTING, LACRIMAL CANALICULUS (Left: Eye)  Patient location during evaluation: Phase II Anesthesia Type: MAC Level of consciousness: awake Pain management: pain level controlled Vital Signs Assessment: post-procedure vital signs reviewed and stable Respiratory status: spontaneous breathing and respiratory function stable Cardiovascular status: blood pressure returned to baseline and stable Postop Assessment: no headache and no apparent nausea or vomiting Anesthetic complications: no Comments: Late entry   No notable events documented.   Last Vitals:  Vitals:   03/02/24 1057 03/02/24 1212  BP: (!) 151/78 (!) 116/91  Pulse: 87 75  Resp: 16 16  Temp: 36.6 C 36.9 C  SpO2: 99% 99%    Last Pain:  Vitals:   03/02/24 1212  TempSrc: Oral  PainSc: 0-No pain                 Coretha Dew

## 2024-03-05 ENCOUNTER — Encounter (HOSPITAL_COMMUNITY): Payer: Self-pay | Admitting: Ophthalmology

## 2024-03-07 ENCOUNTER — Ambulatory Visit (HOSPITAL_BASED_OUTPATIENT_CLINIC_OR_DEPARTMENT_OTHER): Admitting: Pulmonary Disease

## 2024-03-16 ENCOUNTER — Other Ambulatory Visit: Payer: Self-pay

## 2024-03-16 ENCOUNTER — Encounter (HOSPITAL_COMMUNITY)
Admission: RE | Admit: 2024-03-16 | Discharge: 2024-03-16 | Disposition: A | Discharge status: Home / Self Care | Source: Ambulatory Visit | Attending: Ophthalmology | Admitting: Ophthalmology

## 2024-03-16 ENCOUNTER — Encounter (HOSPITAL_COMMUNITY): Payer: Self-pay

## 2024-03-22 NOTE — H&P (Signed)
 Surgical History & Physical  Patient Name: Rachel [name]  DOB: May 08, 1951 Surgery: Cataract extraction with intraocular lens implant phacoemulsification; Right Eye Surgeon: [name] MD Surgery Date: 03/23/2024 Pre-Op Date: 03/05/2024  HPI: A 61 Yr. old female patient 1.  The patient is returning for a cataract follow-up of the left eye. Since the last visit, the affected area is doing well. The patient's vision is improved. The condition's severity is constant. Patient is following medication instructions. is ready to proceed with cataract surgery on OD due to blurred vision. This This is negatively affecting the patient's quality of life, and the patient is unable to function adequately in life with the current level of vision. HPI Completed by Dr. [name]  Medical History: Cataracts OU: Chorioretinal scar, OU: Dermatochalasis  Cancer Cholesterol  Review of Systems Negative Allergic/Immunologic Negative Cardiovascular Negative Constitutional Negative Ear, Nose, Mouth & Throat Negative Endocrine Negative Eyes Negative Gastrointestinal Negative Genitourinary Negative Hemotologic/Lymphatic Negative Integumentary Negative Musculoskeletal Negative Neurological Negative Psychiatry Negative Respiratory  Social Never smoked   Medication Systane Complete, Prednisolone-moxiflox-bromfen,  Zetia, Omega 3-dha-epa-fish oil, Bayer Chewable Aspirin  Sx/Procedures Phaco c IOL OS - dextenza ,  Tonsilectomy, Hysterectomy, Gallbladder Removal, Basal Cell Removal, Oral Surgery  Drug Allergies  Caffeine, Erythromycin  History & Physical: Heent: cataract NECK: supple without bruits LUNGS: lungs clear to auscultation CV: regular rate and rhythm Abdomen: soft and non-tender  Impression & Plan: Assessment: 1.  CATARACT EXTRACTION STATUS; Left Eye (Z98.42) 2.  NUCLEAR SCLEROSIS AGE RELATED; , Right Eye (H25.11) 3.  ASTIGMATISM, REGULAR; Both Eyes (H52.223)  Plan: 1.  3 days  after cataract surgery. Doing well with improved vision and normal IOP. Combo drop 3x/day for 4 days - Dextenza . Call with any worsening vision, pain, or any other concerns.  2.  Cataract accounts for the patient's decreased vision. This visual impairment is not correctable with a tolerable change in glasses or contact lenses. Cataract surgery with an implantation of a new lens should significantly improve the visual and functional status of the patient. Discussed all risks, benefits, alternatives, and potential complications. Discussed the procedures and recovery. Patient desires to have surgery. A-scan ordered and performed today for intra-ocular lens calculations. The surgery will be performed in order to improve vision for driving, reading, and for eye examinations. Recommend phacoemulsification with intra-ocular lens. Recommend Dextenza  for post-operative pain and inflammation. Right Eye. Surgery required to correct imbalance of vision. Dilates well - Lidocaine /Omidria by protocol. Toric Lens.  3.  Worse right eye. Consider toric IOL OD only.

## 2024-03-23 ENCOUNTER — Encounter (HOSPITAL_COMMUNITY)
Admission: RE | Disposition: A | Discharge status: Home / Self Care | Payer: Self-pay | Source: Home / Self Care | Attending: Ophthalmology

## 2024-03-23 ENCOUNTER — Encounter (HOSPITAL_COMMUNITY): Payer: Self-pay | Admitting: Ophthalmology

## 2024-03-23 ENCOUNTER — Ambulatory Visit (HOSPITAL_COMMUNITY): Payer: Self-pay | Admitting: Anesthesiology

## 2024-03-23 ENCOUNTER — Ambulatory Visit (HOSPITAL_COMMUNITY)
Admission: RE | Admit: 2024-03-23 | Discharge: 2024-03-23 | Disposition: A | Discharge status: Home / Self Care | Attending: Ophthalmology | Admitting: Ophthalmology

## 2024-03-23 ENCOUNTER — Other Ambulatory Visit: Payer: Self-pay

## 2024-03-23 DIAGNOSIS — I10 Essential (primary) hypertension: Secondary | ICD-10-CM | POA: Diagnosis not present

## 2024-03-23 DIAGNOSIS — H2511 Age-related nuclear cataract, right eye: Secondary | ICD-10-CM

## 2024-03-23 DIAGNOSIS — Z9842 Cataract extraction status, left eye: Secondary | ICD-10-CM | POA: Diagnosis not present

## 2024-03-23 DIAGNOSIS — H5711 Ocular pain, right eye: Secondary | ICD-10-CM | POA: Insufficient documentation

## 2024-03-23 DIAGNOSIS — H52223 Regular astigmatism, bilateral: Secondary | ICD-10-CM | POA: Insufficient documentation

## 2024-03-23 HISTORY — PX: CATARACT EXTRACTION W/PHACO: SHX586

## 2024-03-23 HISTORY — PX: INSERTION, STENT, DRUG-ELUTING, LACRIMAL CANALICULUS: SHX7453

## 2024-03-23 SURGERY — PHACOEMULSIFICATION, CATARACT, WITH IOL INSERTION
Anesthesia: Monitor Anesthesia Care | Site: Eye | Laterality: Right

## 2024-03-23 MED ORDER — POVIDONE-IODINE 5 % OP SOLN
OPHTHALMIC | Status: DC | PRN
  Administered 2024-03-23: 1 via OPHTHALMIC

## 2024-03-23 MED ORDER — BSS IO SOLN
INTRAOCULAR | Status: DC | PRN
  Administered 2024-03-23: 15 mL via INTRAOCULAR

## 2024-03-23 MED ORDER — LIDOCAINE HCL 3.5 % OP GEL: 1.0000 | Freq: Once | OPHTHALMIC | Status: DC

## 2024-03-23 MED ORDER — PHENYLEPHRINE HCL 2.5 % OP SOLN
1.0000 [drp] | OPHTHALMIC | Status: AC | PRN
  Administered 2024-03-23 (×3): 1 [drp] via OPHTHALMIC

## 2024-03-23 MED ORDER — SODIUM HYALURONATE 10 MG/ML IO SOLUTION
PREFILLED_SYRINGE | INTRAOCULAR | Status: DC | PRN
  Administered 2024-03-23: .85 mL via INTRAOCULAR

## 2024-03-23 MED ORDER — DEXAMETHASONE 0.4 MG OP INST
VAGINAL_INSERT | OPHTHALMIC | Status: AC
  Filled 2024-03-23: qty 1

## 2024-03-23 MED ORDER — LACTATED RINGERS IV SOLN: INTRAVENOUS | Status: DC

## 2024-03-23 MED ORDER — SODIUM CHLORIDE 0.9% FLUSH
INTRAVENOUS | Status: DC | PRN
Start: 2024-03-23 — End: 2024-03-23
  Administered 2024-03-23: 5 mL via INTRAVENOUS

## 2024-03-23 MED ORDER — MOXIFLOXACIN HCL 5 MG/ML IO SOLN
INTRAOCULAR | Status: DC | PRN
  Administered 2024-03-23: .2 mL via INTRACAMERAL

## 2024-03-23 MED ORDER — STERILE WATER FOR IRRIGATION IR SOLN
Status: DC | PRN
  Administered 2024-03-23: 250 mL

## 2024-03-23 MED ORDER — SODIUM HYALURONATE 23MG/ML IO SOSY
PREFILLED_SYRINGE | INTRAOCULAR | Status: DC | PRN
  Administered 2024-03-23: .6 mL via INTRAOCULAR

## 2024-03-23 MED ORDER — MIDAZOLAM HCL 2 MG/2ML IJ SOLN
INTRAMUSCULAR | Status: DC | PRN
  Administered 2024-03-23: 1 mg via INTRAVENOUS

## 2024-03-23 MED ORDER — DEXAMETHASONE 0.4 MG OP INST
VAGINAL_INSERT | OPHTHALMIC | Status: DC | PRN
  Administered 2024-03-23: .4 mg via OPHTHALMIC

## 2024-03-23 MED ORDER — TROPICAMIDE 1 % OP SOLN
1.0000 [drp] | OPHTHALMIC | Status: AC | PRN
  Administered 2024-03-23 (×3): 1 [drp] via OPHTHALMIC

## 2024-03-23 MED ORDER — PHENYLEPHRINE-KETOROLAC 1-0.3 % IO SOLN
INTRAOCULAR | Status: DC | PRN
  Administered 2024-03-23: 500 mL via OPHTHALMIC

## 2024-03-23 MED ORDER — TETRACAINE HCL 0.5 % OP SOLN
1.0000 [drp] | OPHTHALMIC | Status: AC | PRN
  Administered 2024-03-23 (×3): 1 [drp] via OPHTHALMIC

## 2024-03-23 MED ORDER — MIDAZOLAM HCL 2 MG/2ML IJ SOLN
INTRAMUSCULAR | Status: AC
Start: 2024-03-23 — End: 2024-03-23
  Filled 2024-03-23: qty 2

## 2024-03-23 SURGICAL SUPPLY — 12 items
CATARACT SUITE SIGHTPATH (MISCELLANEOUS) ×1 IMPLANT
CLOTH BEACON ORANGE TIMEOUT ST (SAFETY) ×1 IMPLANT
EYE SHIELD UNIVERSAL CLEAR (GAUZE/BANDAGES/DRESSINGS) IMPLANT
FEE CATARACT SUITE SIGHTPATH (MISCELLANEOUS) ×1 IMPLANT
GLOVE BIOGEL PI IND STRL 7.0 (GLOVE) ×2 IMPLANT
LENS IOL EYHANCE TRC 225 14.0 IMPLANT
NDL HYPO 18GX1.5 BLUNT FILL (NEEDLE) ×1 IMPLANT
NEEDLE HYPO 18GX1.5 BLUNT FILL (NEEDLE) ×1 IMPLANT
PAD ARMBOARD POSITIONER FOAM (MISCELLANEOUS) ×1 IMPLANT
SYR TB 1ML LL NO SAFETY (SYRINGE) ×1 IMPLANT
TAPE SURG TRANSPORE 1 IN (GAUZE/BANDAGES/DRESSINGS) IMPLANT
WATER STERILE IRR 250ML POUR (IV SOLUTION) ×1 IMPLANT

## 2024-03-23 NOTE — Op Note (Addendum)
 aDate of procedure: 03/23/24  Pre-operative diagnosis: Visually significant age-related nuclear cataract, Right Eye; Visually Significant Astigmatism, Right Eye (H25.11)  Post-operative diagnosis: Visually significant age-related cataract, Right Eye; Visually Significant Astigmatism, Right Eye Pain and inflammation after cataract surgery, right eye  Procedure: 1. Removal of cataract via phacoemulsification and insertion of intra-ocular lens Lincoln Renshaw and Johnson [id number] +14.0D into the capsular bag of the Right Eye 2. Placement of Dextenza  insert, right lower eyelid  Attending surgeon: Pleas Brill. Altie Savard, MD, MA  Anesthesia: MAC, Topical Akten   Complications: None  Estimated Blood Loss: <53mL (minimal)  Specimens: None  Implants: As above  Indications:  Visually significant age-related cataract, Right Eye; Visually Significant Astigmatism, Right Eye  Procedure:  The patient was seen and identified in the pre-operative area. The operative eye was identified and dilated.  The operative eye was marked.  Pre-operative toric markers were used to mark the eye at 0 and 180 degrees. Topical anesthesia was administered to the operative eye.     The patient was then to the operative suite and placed in the supine position.  A timeout was performed confirming the patient, procedure to be performed, and all other relevant information.   The patient's face was prepped and draped in the usual fashion for intra-ocular surgery.  A lid speculum was placed into the operative eye and the surgical microscope moved into place and focused.  A superotemporal paracentesis was created using a 20 gauge paracentesis blade. Omidria was injected into the anterior chamber.  Shugarcaine was injected into the anterior chamber.  Viscoelastic was injected into the anterior chamber.  A temporal clear-corneal main wound incision was created using a 2.22mm microkeratome.  A continuous curvilinear capsulorrhexis was initiated using  an irrigating cystitome and completed using capsulorrhexis forceps.  Hydrodissection and hydrodeliniation were performed.  Viscoelastic was injected into the anterior chamber.  A phacoemulsification handpiece and a chopper as a second instrument were used to remove the nucleus and epinucleus. The irrigation/aspiration handpiece was used to remove any remaining cortical material.   The capsular bag was reinflated with viscoelastic, checked, and found to be intact.  The intraocular lens was inserted into the capsular bag and dialed into place using a Kuglen hook to 2 degrees.  The irrigation/aspiration handpiece was used to remove any remaining viscoelastic.  The clear corneal wound and paracentesis wounds were then hydrated and checked with Weck-Cels to be watertight. 0.1mL of moxifloxacin  was injected into the anterior chamber. The lid-speculum was removed.    The lower punctum was dilated and filled with Provisc. A Dextenza  implant was placed in the lower canaliculus without complication.  The drape was removed. The patient's face was cleaned with a wet and dry 4x4. A clear shield was taped over the eye. The patient was taken to the post-operative care unit in good condition, having tolerated the procedure well.  Post-Op Instructions: The patient will follow up at [facility] for a same day post-operative evaluation and will receive all other orders and instructions.

## 2024-03-23 NOTE — Anesthesia Procedure Notes (Signed)
 Procedure Name: MAC Date/Time: 03/23/2024 7:52 AM  Performed by: Sherwin Donate, CRNAPre-anesthesia Checklist: Patient identified, Emergency Drugs available, Suction available and Patient being monitored Patient Re-evaluated:Patient Re-evaluated prior to induction Oxygen Delivery Method: Nasal cannula Placement Confirmation: positive ETCO2

## 2024-03-23 NOTE — Discharge Instructions (Signed)
 Please discharge patient when stable, will follow up today with Dr. June Leap at the [facility] office immediately following discharge.  Leave shield in place until visit.  All paperwork with discharge instructions will be given at the office.  [facility] Address:  [address]  [place], [place] [address]

## 2024-03-23 NOTE — Interval H&P Note (Signed)
 History and Physical Interval Note:  03/23/2024 7:50 AM  Rachel [name]  has presented today for surgery, with the diagnosis of nuclear sclerotic cataract, right eye.  The various methods of treatment have been discussed with the patient and family. After consideration of risks, benefits and other options for treatment, the patient has consented to  Procedure(s): PHACOEMULSIFICATION, CATARACT, WITH IOL INSERTION (Right) INSERTION, STENT, DRUG-ELUTING, LACRIMAL CANALICULUS (Right) as a surgical intervention.  The patient's history has been reviewed, patient examined, no change in status, stable for surgery.  I have reviewed the patient's chart and labs.  Questions were answered to the patient's satisfaction.     [name]

## 2024-03-23 NOTE — Transfer of Care (Signed)
 Immediate Anesthesia Transfer of Care Note  Patient: Rachel [name]  Procedure(s) Performed: PHACOEMULSIFICATION, CATARACT, WITH IOL INSERTION (Right: Eye) INSERTION, STENT, DRUG-ELUTING, LACRIMAL CANALICULUS (Right: Eye)  Patient Location: Short Stay  Anesthesia Type:MAC  Level of Consciousness: awake, alert , and oriented  Airway & Oxygen Therapy: Patient Spontanous Breathing  Post-op Assessment: Report given to RN and Post -op Vital signs reviewed and stable  Post vital signs: Reviewed and stable  Last Vitals:  Vitals Value Taken Time  BP    Temp    Pulse    Resp    SpO2      Last Pain:  Vitals:   03/23/24 0712  TempSrc: Oral  PainSc: 0-No pain         Complications: No notable events documented.

## 2024-03-23 NOTE — Anesthesia Preprocedure Evaluation (Signed)
Anesthesia Evaluation  Patient identified by MRN, date of birth, ID band Patient awake    Reviewed: Allergy & Precautions, H&P , NPO status , Patient's Chart, lab work & pertinent test results, reviewed documented beta blocker date and time   Airway Mallampati: II  TM Distance: >3 FB Neck ROM: full    Dental no notable dental hx.    Pulmonary neg pulmonary ROS   Pulmonary exam normal breath sounds clear to auscultation       Cardiovascular Exercise Tolerance: Good hypertension, negative cardio ROS  Rhythm:regular Rate:Normal     Neuro/Psych negative neurological ROS  negative psych ROS   GI/Hepatic negative GI ROS, Neg liver ROS,,,  Endo/Other  negative endocrine ROS    Renal/GU negative Renal ROS  negative genitourinary   Musculoskeletal   Abdominal   Peds  Hematology negative hematology ROS (+)   Anesthesia Other Findings   Reproductive/Obstetrics negative OB ROS                             Anesthesia Physical Anesthesia Plan  ASA: 2  Anesthesia Plan: MAC   Post-op Pain Management:    Induction:   PONV Risk Score and Plan:   Airway Management Planned:   Additional Equipment:   Intra-op Plan:   Post-operative Plan:   Informed Consent: I have reviewed the patients History and Physical, chart, labs and discussed the procedure including the risks, benefits and alternatives for the proposed anesthesia with the patient or authorized representative who has indicated his/her understanding and acceptance.     Dental Advisory Given  Plan Discussed with: CRNA  Anesthesia Plan Comments:        Anesthesia Quick Evaluation

## 2024-03-24 NOTE — Anesthesia Postprocedure Evaluation (Signed)
 Anesthesia Post Note  Patient: ARAYAH KROUSE  Procedure(s) Performed: PHACOEMULSIFICATION, CATARACT, WITH IOL INSERTION (Right: Eye) INSERTION, STENT, DRUG-ELUTING, LACRIMAL CANALICULUS (Right: Eye)  Patient location during evaluation: Phase II Anesthesia Type: MAC Level of consciousness: awake Pain management: pain level controlled Vital Signs Assessment: post-procedure vital signs reviewed and stable Respiratory status: spontaneous breathing and respiratory function stable Cardiovascular status: blood pressure returned to baseline and stable Postop Assessment: no headache and no apparent nausea or vomiting Anesthetic complications: no Comments: Late entry   No notable events documented.   Last Vitals:  Vitals:   03/23/24 0712 03/23/24 0817  BP: (!) 147/84 135/79  Pulse: 83 75  Resp: 18 12  Temp: 36.8 C 36.8 C  SpO2: 97% 98%    Last Pain:  Vitals:   03/23/24 0817  TempSrc: Oral  PainSc: 0-No pain                 Yvonna JINNY Bosworth

## 2024-03-28 ENCOUNTER — Encounter (HOSPITAL_BASED_OUTPATIENT_CLINIC_OR_DEPARTMENT_OTHER)

## 2024-03-28 ENCOUNTER — Ambulatory Visit (HOSPITAL_BASED_OUTPATIENT_CLINIC_OR_DEPARTMENT_OTHER): Admitting: Nurse Practitioner

## 2024-03-29 ENCOUNTER — Encounter (HOSPITAL_COMMUNITY): Payer: Self-pay | Admitting: Ophthalmology

## 2024-06-06 ENCOUNTER — Ambulatory Visit

## 2024-06-14 ENCOUNTER — Other Ambulatory Visit: Payer: Self-pay

## 2024-06-14 DIAGNOSIS — D751 Secondary polycythemia: Secondary | ICD-10-CM

## 2024-06-15 ENCOUNTER — Inpatient Hospital Stay: Attending: Oncology

## 2024-06-15 DIAGNOSIS — D751 Secondary polycythemia: Secondary | ICD-10-CM | POA: Diagnosis present

## 2024-06-15 DIAGNOSIS — G4734 Idiopathic sleep related nonobstructive alveolar hypoventilation: Secondary | ICD-10-CM | POA: Insufficient documentation

## 2024-06-15 LAB — CBC WITH DIFFERENTIAL/PLATELET
Abs Immature Granulocytes: 0.01 K/uL (ref 0.00–0.07)
Basophils Absolute: 0 K/uL (ref 0.0–0.1)
Basophils Relative: 0 %
Eosinophils Absolute: 0 K/uL (ref 0.0–0.5)
Eosinophils Relative: 1 %
HCT: 50.6 % — ABNORMAL HIGH (ref 36.0–46.0)
Hemoglobin: 16 g/dL — ABNORMAL HIGH (ref 12.0–15.0)
Immature Granulocytes: 0 %
Lymphocytes Relative: 37 %
Lymphs Abs: 1.7 K/uL (ref 0.7–4.0)
MCH: 30.5 pg (ref 26.0–34.0)
MCHC: 31.6 g/dL (ref 30.0–36.0)
MCV: 96.4 fL (ref 80.0–100.0)
Monocytes Absolute: 0.3 K/uL (ref 0.1–1.0)
Monocytes Relative: 6 %
Neutro Abs: 2.6 K/uL (ref 1.7–7.7)
Neutrophils Relative %: 56 %
Platelets: 199 K/uL (ref 150–400)
RBC: 5.25 MIL/uL — ABNORMAL HIGH (ref 3.87–5.11)
RDW: 12.6 % (ref 11.5–15.5)
WBC: 4.6 K/uL (ref 4.0–10.5)
nRBC: 0 % (ref 0.0–0.2)

## 2024-06-22 ENCOUNTER — Inpatient Hospital Stay (HOSPITAL_BASED_OUTPATIENT_CLINIC_OR_DEPARTMENT_OTHER): Admitting: Oncology

## 2024-06-22 DIAGNOSIS — D751 Secondary polycythemia: Secondary | ICD-10-CM

## 2024-06-22 NOTE — Assessment & Plan Note (Addendum)
-  Secondary to nocturnal hypoxia.  -Had sleep study on 05/16/23 which showed nocturnal hypoxemia and dysfunction associated with sleep stages or arousal.  It was recommended she follow-up with pulmonology for underlying pulmonary disorder.  It did not recommend CPAP or AutoPap.   -She met with pulmonology Dr. Jude on 08/04/2023 for nocturnal hypoxia and was recommended to have an echocardiogram which showed an EF of 70%, chest x-ray which was also normal and to wear a pulse oximeter at nighttime.   -Patient repeated a pulse oximeter at nighttime and showed that she did dip into the 80s at times.  It was recommended she start 2 L of oxygen at bedtime.  Reports she did not want to start oxygen unless she really needed it.  She continues to sleep better since switching her mattress to a sleep by number -Repeat lab work from 06/15/2024 showed a hemoglobin of 16 (15.7) hematocrit 50.6 with normal differential. -We discussed should her hemoglobin and hematocrit continue to trend up and/or she develops symptoms such as headache, dizziness or changes in her vision we could potentially do phlebotomy or she could donate blood on a quarterly basis.  We discussed waiting until her next lab draw to see if her sleeping habits make any difference in her lab work.

## 2024-06-22 NOTE — Progress Notes (Signed)
 [facility] OFFICE PROGRESS NOTE  Shona Norleen PEDLAR, MD  I connected with Erminio LITTIE Blumenthal on 06/24/24 at  2:30 PM EDT by telephone visit and verified that I am speaking with the correct person using two identifiers.   I discussed the limitations, risks, security and privacy concerns of performing an evaluation and management service by telemedicine and the availability of in-person appointments. I also discussed with the patient that there may be a patient responsible charge related to this service. The patient expressed understanding and agreed to proceed.   Other persons participating in the visit and their role in the encounter: NP, Patient    Patient's location: Home  Provider's location: Clinic    ASSESSMENT & PLAN:    Assessment & Plan Polycythemia -Secondary to nocturnal hypoxia.  -Had sleep study on 05/16/23 which showed nocturnal hypoxemia and dysfunction associated with sleep stages or arousal.  It was recommended she follow-up with pulmonology for underlying pulmonary disorder.  It did not recommend CPAP or AutoPap.   -She met with pulmonology Dr. Jude on 08/04/2023 for nocturnal hypoxia and was recommended to have an echocardiogram which showed an EF of 70%, chest x-ray which was also normal and to wear a pulse oximeter at nighttime.   -Patient repeated a pulse oximeter at nighttime and showed that she did dip into the 80s at times.  It was recommended she start 2 L of oxygen at bedtime.  Reports she did not want to start oxygen unless she really needed it.  She continues to sleep better since switching her mattress to a sleep by number -Repeat lab work from 06/15/2024 showed a hemoglobin of 16 (15.7) hematocrit 50.6 with normal differential. -We discussed should her hemoglobin and hematocrit continue to trend up and/or she develops symptoms such as headache, dizziness or changes in her vision we could potentially do phlebotomy or she could donate blood on a quarterly  basis.  We discussed waiting until her next lab draw to see if her sleeping habits make any difference in her lab work.  Orders Placed This Encounter  Procedures   CBC with Differential    Standing Status:   Future    Expected Date:   12/20/2024    Expiration Date:   06/22/2025    INTERVAL HISTORY: Patient returns for follow-up for secondary polycythemia in the setting of nocturnal hypoxemia.  In the interim, she had cataract surgery on both of her eyes.  Reports she sees much better but now has to wear glasses to read her book.  Reports she continues to sleep well since switching her mattress for a sleep by number.  States she does have trouble breathing through her left nostril after having a mass removed back in 2017.  She is not sure if this is contributing some to her low oxygen levels.  Pulmonology (Dr. Malachy) recommended she do an overnight pulse oximetry check.  Per patient, she did drop into the 80s at times and they recommended 2 L of oxygen at bedtime.  Reports she does not think that she needs this and never picked this up.  They are going to schedule her for PFTs in the next 4 to 6 weeks.   In the interim, she denies any hospitalizations, surgeries or changes to her baseline health.  Reports she was just put on Zetia for her high cholesterol and repeat labs show significant improvement.   Had cologuard on 10/17/23 which was negative.    We reviewed cbc, cmp  SUMMARY OF HEMATOLOGIC HISTORY: Oncology History   No history exists.     CBC    Component Value Date/Time   WBC 4.6 06/15/2024 0955   RBC 5.25 (H) 06/15/2024 0955   HGB 16.0 (H) 06/15/2024 0955   HCT 50.6 (H) 06/15/2024 0955   PLT 199 06/15/2024 0955   MCV 96.4 06/15/2024 0955   MCH 30.5 06/15/2024 0955   MCHC 31.6 06/15/2024 0955   RDW 12.6 06/15/2024 0955   LYMPHSABS 1.7 06/15/2024 0955   MONOABS 0.3 06/15/2024 0955   EOSABS 0.0 06/15/2024 0955   BASOSABS 0.0 06/15/2024 0955       Latest Ref Rng &  Units 04/14/2021    8:56 AM 01/13/2015    9:00 AM  CMP  Glucose 65 - 99 mg/dL 92  899   BUN 7 - 25 mg/dL 15  20   Creatinine 9.39 - 1.00 mg/dL 9.31  9.30   Sodium 864 - 146 mmol/L 141  142   Potassium 3.5 - 5.3 mmol/L 4.8  4.9   Chloride 98 - 110 mmol/L 104  104   CO2 20 - 32 mmol/L 27  32   Calcium 8.6 - 10.4 mg/dL 9.4  9.5   Total Protein 6.1 - 8.1 g/dL 7.0  7.4   Total Bilirubin 0.2 - 1.2 mg/dL 0.6  0.7   Alkaline Phos 39 - 117 U/L  51   AST 10 - 35 U/L 16  21   ALT 6 - 29 U/L 13  17      No results found for: FERRITIN, VITAMINB12  There were no vitals filed for this visit.  Review of System:  Review of Systems  Constitutional:  Positive for malaise/fatigue.    Physical Exam: Physical Exam Neurological:     Mental Status: She is alert and oriented to person, place, and time.      I provided 18 minutes of non face-to-face telephone visit time during this encounter, and > 50% was spent counseling as documented under my assessment & plan.   Delon Hope, NP 06/24/2024 11:46 AM

## 2024-06-26 ENCOUNTER — Telehealth: Payer: Self-pay | Admitting: Nurse Practitioner

## 2024-06-26 NOTE — Telephone Encounter (Signed)
 06/19/2024 ONO on room air: 13 min 32 sec </88%, which does meet criteria for O2 therapy at night (>5 min is threshold). Her SpO2 low was 82% and average was 92%. I would recommend the oxygen 2 lpm at night; however, if she still would prefer to refrain, we can continue to monitor. Her average being above 90% is reassuring. Low O2 levels puts more strain on the heart and can lead to associated cardiovascular risks overtime. Thanks!

## 2024-07-10 ENCOUNTER — Ambulatory Visit
Admission: RE | Admit: 2024-07-10 | Discharge: 2024-07-10 | Disposition: A | Discharge status: Home / Self Care | Source: Ambulatory Visit | Attending: Nurse Practitioner | Admitting: Nurse Practitioner

## 2024-07-10 DIAGNOSIS — Z1231 Encounter for screening mammogram for malignant neoplasm of breast: Secondary | ICD-10-CM

## 2024-08-27 ENCOUNTER — Other Ambulatory Visit (HOSPITAL_COMMUNITY): Payer: Self-pay | Admitting: Nurse Practitioner

## 2024-08-27 DIAGNOSIS — M810 Age-related osteoporosis without current pathological fracture: Secondary | ICD-10-CM

## 2024-08-27 DIAGNOSIS — Z13820 Encounter for screening for osteoporosis: Secondary | ICD-10-CM

## 2024-08-29 ENCOUNTER — Encounter (INDEPENDENT_AMBULATORY_CARE_PROVIDER_SITE_OTHER): Payer: Self-pay

## 2024-08-31 ENCOUNTER — Ambulatory Visit (HOSPITAL_COMMUNITY)
Admission: RE | Admit: 2024-08-31 | Discharge: 2024-08-31 | Disposition: A | Discharge status: Home / Self Care | Source: Ambulatory Visit | Attending: Nurse Practitioner | Admitting: Nurse Practitioner

## 2024-08-31 DIAGNOSIS — M810 Age-related osteoporosis without current pathological fracture: Secondary | ICD-10-CM | POA: Insufficient documentation

## 2024-08-31 DIAGNOSIS — Z13820 Encounter for screening for osteoporosis: Secondary | ICD-10-CM | POA: Diagnosis present

## 2024-10-23 ENCOUNTER — Ambulatory Visit (INDEPENDENT_AMBULATORY_CARE_PROVIDER_SITE_OTHER): Admitting: Otolaryngology

## 2024-10-23 ENCOUNTER — Encounter (INDEPENDENT_AMBULATORY_CARE_PROVIDER_SITE_OTHER): Payer: Self-pay | Admitting: Otolaryngology

## 2024-10-23 VITALS — BP 143/84 | HR 82 | Ht 61.0 in | Wt 145.0 lb

## 2024-10-23 DIAGNOSIS — J342 Deviated nasal septum: Secondary | ICD-10-CM | POA: Diagnosis not present

## 2024-10-23 DIAGNOSIS — J343 Hypertrophy of nasal turbinates: Secondary | ICD-10-CM | POA: Insufficient documentation

## 2024-10-23 DIAGNOSIS — J310 Chronic rhinitis: Secondary | ICD-10-CM | POA: Insufficient documentation

## 2024-10-23 NOTE — Progress Notes (Signed)
 CC: Chronic nasal congestion  Discussed the use of AI scribe software for clinical note transcription with the patient, who gave verbal consent to proceed.  History of Present Illness Rachel [name] is a 74 year old female with prior left maxillary cyst excision who presents with chronic left-sided nasal congestion and impaired nasal breathing.  She reports intermittent nasal congestion predominantly affecting the left side, with a subjective reduction in airflow compared to the right. Symptoms are exacerbated during seasonal changes, particularly in the fall, and are associated with allergic symptoms. She occasionally uses over-the-counter antihistamines such as cetirizine and has previously used intranasal fluticasone , which provided improvement, though she prefers to avoid regular medication. She denies recent sinus infections, facial pain, purulent nasal discharge, epistaxis, or anosmia.  She underwent surgical excision of a left maxillary cyst in 2017 via an intraoral approach and expresses concern that this may contribute to her current nasal symptoms.  She denies any other otolaryngologic surgeries except for tonsillectomy and adenoidectomy in childhood. She has no history of nasal polyps, trauma, or other sinonasal procedures.   Past Medical History:  Diagnosis Date   Abnormal RBC    elevated   Anxiety    High cholesterol    PONV (postoperative nausea and vomiting)     Past Surgical History:  Procedure Laterality Date   ABDOMINAL HYSTERECTOMY     CATARACT EXTRACTION W/PHACO Left 03/02/2024   Procedure: PHACOEMULSIFICATION, CATARACT, WITH IOL INSERTION;  Surgeon: Harrie Agent, MD;  Location: [facility];  Service: Ophthalmology;  Laterality: Left;  CDE: 11.05   CATARACT EXTRACTION W/PHACO Right 03/23/2024   Procedure: PHACOEMULSIFICATION, CATARACT, WITH IOL INSERTION;  Surgeon: Harrie Agent, MD;  Location: [facility];  Service: Ophthalmology;  Laterality: Right;  CDE: 5.13    CHOLECYSTECTOMY N/A 01/17/2015   Procedure: LAPAROSCOPIC CHOLECYSTECTOMY;  Surgeon: Oneil Budge Md, MD;  Location: [facility];  Service: General;  Laterality: N/A;   INSERTION, STENT, DRUG-ELUTING, LACRIMAL CANALICULUS Left 03/02/2024   Procedure: INSERTION, STENT, DRUG-ELUTING, LACRIMAL CANALICULUS;  Surgeon: Harrie Agent, MD;  Location: [facility];  Service: Ophthalmology;  Laterality: Left;   INSERTION, STENT, DRUG-ELUTING, LACRIMAL CANALICULUS Right 03/23/2024   Procedure: INSERTION, STENT, DRUG-ELUTING, LACRIMAL CANALICULUS;  Surgeon: Harrie Agent, MD;  Location: [facility];  Service: Ophthalmology;  Laterality: Right;   MOHS SURGERY  07/2020   Dr. Lloyd   MOUTH SURGERY  03/2020   mass in left side at top- Dr.Owsley   TONSILLECTOMY     TUBAL LIGATION      Family History  Problem Relation Age of Onset   Hypertension Mother    Tremor Mother    Thyroid  disease Mother    Cancer Father    Liver cancer Father    Alzheimer's disease Father    Diabetes Paternal Aunt    Heart disease Paternal Aunt    Brain cancer Paternal Uncle    Parkinson's disease Neg Hx    Heart attack Neg Hx    Stroke Neg Hx     Social History:  reports that she has never smoked. She has been exposed to tobacco smoke. She has never used smokeless tobacco. She reports that she does not drink alcohol  and does not use drugs.  Allergies: Allergies[1]  Prior to Admission medications  Medication Sig Start Date End Date Taking? Authorizing Provider  aspirin EC 81 MG tablet Take 81 mg by mouth daily. Swallow whole.   Yes [provider]  cyanocobalamin  (VITAMIN B12) 1000 MCG tablet Take 1,000 mcg by mouth daily.  Yes [provider]  ezetimibe (ZETIA) 10 MG tablet Take 1 tablet by mouth daily. 11/24/23  Yes [provider]  IBUPROFEN PO Take 1 tablet by mouth as needed.   Yes [provider]  Multiple Vitamins-Minerals (MULTIVITAMIN ADULTS PO) Take 2 tablets by mouth daily. MVI with omega  3 Natures Made   Yes [provider]  Probiotic Product (PROBIOTIC DAILY PO) Take by mouth.   Yes [provider]    Blood pressure (!) 143/84, pulse 82, height 5' 1 (1.549 m), weight 145 lb (65.8 kg), SpO2 94%. Exam: General: Communicates without difficulty, well nourished, no acute distress. Head: Normocephalic, no evidence injury, no tenderness, facial buttresses intact without stepoff. Face/sinus: No tenderness to palpation and percussion. Facial movement is normal and symmetric. Eyes: PERRL, EOMI. No scleral icterus, conjunctivae clear. Neuro: CN II exam reveals vision grossly intact.  No nystagmus at any point of gaze. Ears: Auricles well formed without lesions.  Ear canals are intact without mass or lesion.  No erythema or edema is appreciated.  The TMs are intact without fluid. Nose: External evaluation reveals normal support and skin without lesions.  Dorsum is intact.  Anterior rhinoscopy reveals congested mucosa over anterior aspect of inferior turbinates and intact septum.  No purulence noted. Oral:  Oral cavity and oropharynx are intact, symmetric, without erythema or edema.  Mucosa is moist without lesions. Neck: Full range of motion without pain.  There is no significant lymphadenopathy.  No masses palpable.  Thyroid  bed within normal limits to palpation.  Parotid glands and submandibular glands equal bilaterally without mass.  Trachea is midline. Neuro:  CN 2-12 grossly intact.   Procedure:  Flexible Nasal Endoscopy: Description: Risks, benefits, and alternatives of flexible endoscopy were explained to the patient.  Specific mention was made of the risk of throat numbness with difficulty swallowing, possible bleeding from the nose and mouth, and pain from the procedure.  The patient gave oral consent to proceed.  The flexible scope was inserted into the right nasal cavity.  Endoscopy of the interior nasal cavity, superior, inferior, and middle meatus was performed. The  sphenoid-ethmoid recess was examined. Edematous mucosa was noted.  No polyp, mass, or lesion was appreciated. Nasal septal deviation noted. Olfactory cleft was clear.  Nasopharynx was clear.  Turbinates were hypertrophied but without mass.  The procedure was repeated on the contralateral side with similar findings.  The patient tolerated the procedure well.  Assessment & Plan Chronic rhinitis with deviated nasal septum and bilateral inferior turbinate hypertrophy Chronic nasal congestion, predominantly left-sided, attributed to leftward septal deviation and turbinate hypertrophy. No evidence of nasal polyps, cysts, or infection. Prior left maxillary cyst removal via intraoral approach is not contributing to current symptoms. Symptoms are mild but bothersome, especially during allergy seasons.  - Sent prescription for intranasal corticosteroid spray (Flonase ) to [facility] in [place]. - Provided education on proper nasal spray technique to minimize risk of epistaxis. - Advised daily, consistent use of intranasal corticosteroid for several weeks to assess efficacy. - Scheduled follow-up in 2 months to reassess symptoms, particularly as spring allergy season approaches.       Braelynn Lupton W Ihsan Nomura 10/23/2024, 1:12 PM      [1]  Allergies Allergen Reactions   Caffeine     makes me very jittery   Pravastatin Other (See Comments)    Muscle aches   E-Mycin [Erythromycin] Rash

## 2024-12-18 ENCOUNTER — Inpatient Hospital Stay

## 2024-12-21 ENCOUNTER — Ambulatory Visit (INDEPENDENT_AMBULATORY_CARE_PROVIDER_SITE_OTHER): Admitting: Otolaryngology

## 2024-12-25 ENCOUNTER — Inpatient Hospital Stay: Admitting: Oncology

## 2024-12-25 ENCOUNTER — Ambulatory Visit: Admitting: Oncology
